# Patient Record
Sex: Female | Born: 1950 | Hispanic: No
Health system: Southern US, Community
[De-identification: ages and names within clinical notes are randomized; demographics above are authoritative.]

## PROBLEM LIST (undated history)

## (undated) DIAGNOSIS — I219 Acute myocardial infarction, unspecified: Secondary | ICD-10-CM

## (undated) DIAGNOSIS — I10 Essential (primary) hypertension: Secondary | ICD-10-CM

---

## 2020-09-02 ENCOUNTER — Emergency Department (HOSPITAL_COMMUNITY): Payer: Medicare Other

## 2020-09-02 ENCOUNTER — Observation Stay (HOSPITAL_COMMUNITY)
Admission: EM | Admit: 2020-09-02 | Discharge: 2020-09-03 | Disposition: A | Discharge status: Home / Self Care | Payer: Medicare Other | Attending: Internal Medicine | Admitting: Internal Medicine

## 2020-09-02 ENCOUNTER — Other Ambulatory Visit: Payer: Self-pay

## 2020-09-02 ENCOUNTER — Encounter (HOSPITAL_COMMUNITY): Payer: Self-pay | Admitting: Emergency Medicine

## 2020-09-02 DIAGNOSIS — Z20822 Contact with and (suspected) exposure to covid-19: Secondary | ICD-10-CM | POA: Diagnosis not present

## 2020-09-02 DIAGNOSIS — W1839XA Other fall on same level, initial encounter: Secondary | ICD-10-CM | POA: Diagnosis not present

## 2020-09-02 DIAGNOSIS — S0181XA Laceration without foreign body of other part of head, initial encounter: Secondary | ICD-10-CM | POA: Diagnosis not present

## 2020-09-02 DIAGNOSIS — E119 Type 2 diabetes mellitus without complications: Secondary | ICD-10-CM | POA: Diagnosis not present

## 2020-09-02 DIAGNOSIS — W19XXXA Unspecified fall, initial encounter: Secondary | ICD-10-CM

## 2020-09-02 DIAGNOSIS — I10 Essential (primary) hypertension: Secondary | ICD-10-CM | POA: Diagnosis not present

## 2020-09-02 DIAGNOSIS — S0292XA Unspecified fracture of facial bones, initial encounter for closed fracture: Secondary | ICD-10-CM | POA: Diagnosis not present

## 2020-09-02 DIAGNOSIS — M1A9XX0 Chronic gout, unspecified, without tophus (tophi): Secondary | ICD-10-CM

## 2020-09-02 DIAGNOSIS — G9389 Other specified disorders of brain: Secondary | ICD-10-CM

## 2020-09-02 DIAGNOSIS — S0291XB Unspecified fracture of skull, initial encounter for open fracture: Secondary | ICD-10-CM

## 2020-09-02 DIAGNOSIS — Z23 Encounter for immunization: Secondary | ICD-10-CM | POA: Insufficient documentation

## 2020-09-02 DIAGNOSIS — S0285XB Fracture of orbit, unspecified, initial encounter for open fracture: Secondary | ICD-10-CM | POA: Diagnosis not present

## 2020-09-02 DIAGNOSIS — S0219XB Other fracture of base of skull, initial encounter for open fracture: Secondary | ICD-10-CM

## 2020-09-02 DIAGNOSIS — M109 Gout, unspecified: Secondary | ICD-10-CM

## 2020-09-02 HISTORY — DX: Essential (primary) hypertension: I10

## 2020-09-02 HISTORY — DX: Acute myocardial infarction, unspecified: I21.9

## 2020-09-02 LAB — BASIC METABOLIC PANEL
Anion gap: 10 (ref 5–15)
BUN: 13 mg/dL (ref 8–23)
CO2: 24 mmol/L (ref 22–32)
Calcium: 9.3 mg/dL (ref 8.9–10.3)
Chloride: 101 mmol/L (ref 98–111)
Creatinine, Ser: 0.84 mg/dL (ref 0.44–1.00)
GFR, Estimated: >60 mL/min (ref >60)
Glucose, Bld: 130 mg/dL — ABNORMAL HIGH (ref 70–99)
Potassium: 3.4 mmol/L — ABNORMAL LOW (ref 3.5–5.1)
Sodium: 135 mmol/L (ref 135–145)

## 2020-09-02 LAB — CBC WITH DIFFERENTIAL/PLATELET
Abs Immature Granulocytes: 0.04 10*3/uL (ref 0.00–0.07)
Basophils Absolute: 0 10*3/uL (ref 0.0–0.1)
Basophils Relative: 0 %
Eosinophils Absolute: 0.1 10*3/uL (ref 0.0–0.5)
Eosinophils Relative: 1 %
HCT: 38.9 % (ref 36.0–46.0)
Hemoglobin: 12.5 g/dL (ref 12.0–15.0)
Immature Granulocytes: 0 %
Lymphocytes Relative: 13 %
Lymphs Abs: 1.4 10*3/uL (ref 0.7–4.0)
MCH: 26.6 pg (ref 26.0–34.0)
MCHC: 32.1 g/dL (ref 30.0–36.0)
MCV: 82.8 fL (ref 80.0–100.0)
Monocytes Absolute: 0.5 10*3/uL (ref 0.1–1.0)
Monocytes Relative: 5 %
Neutro Abs: 9 10*3/uL — ABNORMAL HIGH (ref 1.7–7.7)
Neutrophils Relative %: 81 %
Platelets: 322 10*3/uL (ref 150–400)
RBC: 4.7 MIL/uL (ref 3.87–5.11)
RDW: 13.9 % (ref 11.5–15.5)
WBC: 11 10*3/uL — ABNORMAL HIGH (ref 4.0–10.5)
nRBC: 0 % (ref 0.0–0.2)

## 2020-09-02 MED ORDER — ACETAMINOPHEN 325 MG RE SUPP: 325.0000 mg | Freq: Four times a day (QID) | RECTAL | Status: DC | PRN

## 2020-09-02 MED ORDER — ALLOPURINOL 100 MG PO TABS
100.0000 mg | ORAL_TABLET | Freq: Every day | ORAL | Status: DC
  Administered 2020-09-03: 10:00:00 100 mg via ORAL
  Filled 2020-09-02: qty 1

## 2020-09-02 MED ORDER — HYDROCHLOROTHIAZIDE 12.5 MG PO CAPS
12.5000 mg | ORAL_CAPSULE | Freq: Two times a day (BID) | ORAL | Status: DC
  Administered 2020-09-03: 10:00:00 12.5 mg via ORAL
  Filled 2020-09-02: qty 1

## 2020-09-02 MED ORDER — LISINOPRIL 20 MG PO TABS
20.0000 mg | ORAL_TABLET | Freq: Two times a day (BID) | ORAL | Status: DC
  Administered 2020-09-03: 10:00:00 20 mg via ORAL
  Filled 2020-09-02: qty 1

## 2020-09-02 MED ORDER — FENTANYL CITRATE (PF) 100 MCG/2ML IJ SOLN
25.0000 ug | INTRAMUSCULAR | Status: DC | PRN
Start: 2020-09-02 — End: 2020-09-03
  Administered 2020-09-02 – 2020-09-03 (×4): 25 ug via INTRAVENOUS
  Filled 2020-09-02 (×4): qty 2

## 2020-09-02 MED ORDER — QUINAPRIL-HYDROCHLOROTHIAZIDE 20-12.5 MG PO TABS: 1.0000 | ORAL_TABLET | Freq: Two times a day (BID) | ORAL | Status: DC

## 2020-09-02 MED ORDER — LIDOCAINE-EPINEPHRINE (PF) 2 %-1:200000 IJ SOLN
10.0000 mL | Freq: Once | INTRAMUSCULAR | Status: AC
  Administered 2020-09-02: 10 mL
  Filled 2020-09-02: qty 20

## 2020-09-02 MED ORDER — TETANUS-DIPHTH-ACELL PERTUSSIS 5-2.5-18.5 LF-MCG/0.5 IM SUSY
0.5000 mL | PREFILLED_SYRINGE | Freq: Once | INTRAMUSCULAR | Status: AC
  Administered 2020-09-02: 23:00:00 0.5 mL via INTRAMUSCULAR
  Filled 2020-09-02: qty 0.5

## 2020-09-02 MED ORDER — ONDANSETRON HCL 4 MG/2ML IJ SOLN: 4.0000 mg | Freq: Four times a day (QID) | INTRAMUSCULAR | Status: DC | PRN

## 2020-09-02 MED ORDER — CLINDAMYCIN PHOSPHATE 900 MG/50ML IV SOLN
900.0000 mg | Freq: Once | INTRAVENOUS | Status: AC
  Administered 2020-09-02: 22:00:00 900 mg via INTRAVENOUS
  Filled 2020-09-02: qty 50

## 2020-09-02 MED ORDER — ONDANSETRON HCL 4 MG PO TABS: 4.0000 mg | ORAL_TABLET | Freq: Four times a day (QID) | ORAL | Status: DC | PRN

## 2020-09-02 MED ORDER — ACETAMINOPHEN 325 MG PO TABS: 325.0000 mg | ORAL_TABLET | Freq: Four times a day (QID) | ORAL | Status: DC | PRN

## 2020-09-02 MED ORDER — METOPROLOL SUCCINATE ER 50 MG PO TB24
200.0000 mg | ORAL_TABLET | Freq: Every day | ORAL | Status: DC
  Administered 2020-09-03: 10:00:00 200 mg via ORAL
  Filled 2020-09-02: qty 4

## 2020-09-02 NOTE — H&P (Signed)
History and Physical   Connie Schmidt EXB:[id number] DOB: 05-27-1951 DOA: 09/02/2020  PCP: Dr. Brayton Layman ([place]) Outpatient Specialists: none Patient coming from: home  I have personally briefly reviewed patient's old medical records in [facility] EMR.  Chief Concern: fall  HPI: Connie Schmidt is a 69 y.o. female with medical history significant for hypertension, non-insulin dependent diabetes, gout (last flare was about 1 year ago), presented to the ED for chief concern fo fall and facial laceration.   She is currently visiting her son and daughter-in-law from [place].  She was walking and the neighbor's two dogs chased her. She is afraid of dogs and she slipped and fell down a steep hill. She reports that she hit her face and right arm and right knee. She reports at the peak, her pain was a 10/10, throbbing, persistent. She denies associated vision changes. She reports the fentanyl in the ED improved her pain. She states she is not taking a blood thinner.  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    Social history: she lives at home by herself in [place] and is visiting her son and DIL in [place]. She denies tobacco, etoh, and recreational drugs use.   ROS: Constitutional: no weight change, no fever ENT/Mouth: no sore throat, no rhinorrhea Eyes: no eye pain, no vision changes Cardiovascular: no chest pain, no dyspnea,  no edema, no palpitations Respiratory: no cough, no sputum, no wheezing Gastrointestinal: no nausea, no vomiting, no diarrhea, no constipation Genitourinary: no urinary incontinence, no dysuria, no hematuria Musculoskeletal: no arthralgias, no myalgias Skin: no skin lesions, no pruritus, + laceration on her glabella  Neuro: + weakness, no loss of consciousness, no syncope Psych: no anxiety, no depression, - decrease appetite Heme/Lymph: no bruising, no  bleeding  TPA - caused right eye blindness  ED Course: Discussed with ED provider, patient is requiring admission to [facility] for ENT evaluation and possible surgery.  ENT Dr.Rosen has accepted the patient and request that she be NPO.  Assessment/Plan  Active Problems:   Fall   Comminuted right frontoethmoidal recess and displaced fracture of the anterior right lamina papyracea  - ED provider called ENT doctor called, Dr. Pollyann Kennedy has accepted the patient and request that she be transferred to [facility]  - NPO  -Status post clindamycin 900 mg IV once -Clindamycin 300 mg IV every 6 hours for oral facial laceration with extraconal gas, ENT provider to make further recommendations is appreciated - Inpatient with telemetry   Mildly displaced bone fragments in the supra perio medial right orbit with mild contusion and trace extraconal gas-neurosurgery has been consulted and states no indication for craniotomy  Laceration on glabella - poa, status post suturing per ED provider   Hypertension - elevated - Resumed home antihypertensive  PRN: acetaminophen and ondansetron   Chart reviewed.   DVT prophylaxis: ted hose Code Status: Limited, no cpr/chest compressions. Yes, to intubation.  Diet: NPO  Family Communication: updated daughter in law at bedside. Disposition Plan: pending clinical course.  Consults called: ENT and neuro surgery Admission status: Inpatient with telemetry  Past Medical History:  Diagnosis Date  . Hypertension   . MI (myocardial infarction) (HCC)    History reviewed. No pertinent surgical history.  Social History:  has no history on file for tobacco use, alcohol use, and drug use.  Allergies  Allergen Reactions  .  T-Pa [Alteplase]     Blindness   No family history on file. Family history: Family history reviewed and not pertinent  Prior to Admission medications   Medication Sig Start Date End Date Taking? Authorizing Provider  allopurinol  (ZYLOPRIM) 100 MG tablet Take 100 mg by mouth daily. 07/31/20  Yes [provider]  cholecalciferol (VITAMIN D3) 25 MCG (1000 UNIT) tablet Take 1,000 Units by mouth daily.   Yes [provider]  Cyanocobalamin (VITAMIN B 12 PO) Take 1 tablet by mouth daily at 6 (six) AM.   Yes [provider]  metFORMIN (GLUCOPHAGE) 1000 MG tablet Take 1,000 mg by mouth daily.   Yes [provider]  metoprolol (TOPROL-XL) 200 MG 24 hr tablet Take 200 mg by mouth daily. 07/31/20  Yes [provider]  Omega-3 Fatty Acids (FISH OIL) 1000 MG CPDR Take 1,000 mg by mouth daily.   Yes [provider]  quinapril-hydrochlorothiazide (ACCURETIC) 20-12.5 MG tablet Take 1 tablet by mouth in the morning and at bedtime. 06/27/20  Yes [provider]   Physical Exam: Vitals:   09/03/20 0030 09/03/20 0250 09/03/20 0300 09/03/20 0608  BP: (!) 166/78 (!) 156/83 140/70 (!) 161/92  Pulse: 80 77 67 67  Resp: 15 17  (!) 21  Temp:    98.1 F (36.7 C)  TempSrc:    Oral  SpO2: 100% 95% 92% 96%  Weight:      Height:       Constitutional: appears age-appropriate, NAD, calm, comfortable Eyes: PERRL, lids and conjunctivae normal ENMT: Mucous membranes are moist. Posterior pharynx clear of any exudate or lesions. Age-appropriate dentition. Hearing appropriate Neck: normal, supple, no masses, no thyromegaly Respiratory: clear to auscultation bilaterally, no wheezing, no crackles. Normal respiratory effort. No accessory muscle use.  Cardiovascular: Regular rate and rhythm, no murmurs / rubs / gallops. No extremity edema. 2+ pedal pulses. No carotid bruits.  Abdomen: no tenderness, no masses palpated, no hepatosplenomegaly. Bowel sounds positive.  Musculoskeletal: no clubbing / cyanosis. No joint deformity upper and lower extremities. Good ROM, no contractures, no atrophy. Normal muscle tone.  Skin: Laceration at the forehead present on admission Neurologic: Sensation  intact. Strength 5/5 in all 4.  Psychiatric: Normal judgment and insight. Alert and oriented x 3. Normal mood.   EKG: independently reviewed, showing not indicated  x-rays on Admission: I personally reviewed and I agree with radiologist reading as below.  DG Wrist Complete Right  Result Date: 09/02/2020 CLINICAL DATA:  Pain after a fall EXAM: RIGHT WRIST - COMPLETE 3+ VIEW COMPARISON:  None. FINDINGS: Degenerative changes about the radiocarpal articulation. No acute fracture or dislocation. Scaphoid intact. IMPRESSION: Degenerative change, without acute osseous finding. Electronically Signed   By: Jeronimo Greaves M.D.   On: 09/02/2020 19:27   CT Head Wo Contrast  Result Date: 09/02/2020 CLINICAL DATA:  69 year old female status post fall striking head. Right eye laceration. EXAM: CT HEAD WITHOUT CONTRAST TECHNIQUE: Contiguous axial images were obtained from the base of the skull through the vertex without intravenous contrast. COMPARISON:  Face CT reported separately. FINDINGS: Brain: Despite frontal bone fractures no pneumocephalus identified. No midline shift, ventriculomegaly, mass effect, evidence of mass lesion, intracranial hemorrhage or evidence of cortically based acute infarction. Gray-white matter differentiation is within normal limits throughout the brain. Vascular: Mild Calcified atherosclerosis at the skull base. No suspicious intracranial vascular hyperdensity. Skull: Comminuted, through and through fracture of the right frontal sinus (series 4, image 10) and right frontoethmoidal recess. Associated fracture  of the anterior right lamina papyracea. Anterior wall left frontal sinus fracture on series 4, image 9. See also face CT. No other skull fracture identified. Sinuses/Orbits: Through and through fracture of the right frontal sinus (series 4, image 10) with a small volume of hemorrhage within the anterior frontal and ethmoid air cells. See also face CT reported separately. Tympanic  cavities and mastoids appear clear. Other: Right forehead scalp hematoma with a small volume of subcutaneous gas likely escaping from the fractured anterior paranasal sinuses. Small volume right intraorbital gas also. Underlying chronic postoperative changes to the right globe. See face CT reported separately. Elsewhere scalp soft tissues appear normal. IMPRESSION: 1. Comminuted skull fractures through the frontal sinuses, right frontoethmoidal recess, and medial right orbit. See also face CT reported separately. 2. Normal noncontrast CT appearance of the brain. No pneumocephalus or intracranial hemorrhage identified. 3. Right forehead scalp hematoma with subcutaneous and intraorbital gas. Electronically Signed   By: Odessa Fleming M.D.   On: 09/02/2020 19:14   CT Cervical Spine Wo Contrast  Result Date: 09/02/2020 CLINICAL DATA:  69 year old female status post fall striking head. Right eye laceration. EXAM: CT CERVICAL SPINE WITHOUT CONTRAST TECHNIQUE: Multidetector CT imaging of the cervical spine was performed without intravenous contrast. Multiplanar CT image reconstructions were also generated. COMPARISON:  Head and face CT reported separately today. FINDINGS: Alignment: Mild straightening of cervical lordosis. Cervicothoracic junction alignment is within normal limits. Bilateral posterior element alignment is within normal limits. Skull base and vertebrae: Visualized skull base is intact. No atlanto-occipital dissociation. No acute osseous abnormality identified in the cervical spine. Soft tissues and spinal canal: No prevertebral fluid or swelling. No visible canal hematoma. Negative visible noncontrast neck soft tissues aside from calcified carotid atherosclerosis. Disc levels: Cervical spine degeneration with no cervical spinal stenosis suspected. Upper chest: Visible upper thoracic levels appear intact. Mild motion artifact at the lung apices which appear clear. Calcified aortic atherosclerosis. IMPRESSION:  1. No acute traumatic injury identified in the cervical spine. 2. See Face CT reported separately. Electronically Signed   By: Odessa Fleming M.D.   On: 09/02/2020 19:25   CT Knee Right Wo Contrast  Result Date: 09/02/2020 CLINICAL DATA:  Concern for tibial plateau fracture. EXAM: CT OF THE right KNEE WITHOUT CONTRAST TECHNIQUE: Multidetector CT imaging of the knee was performed according to the standard protocol. Multiplanar CT image reconstructions were also generated. COMPARISON:  X-ray right knee 09/02/2020. FINDINGS: Bones/Joint/Cartilage Subchondral cystic degenerative changes of lateral tibial plateau. Mild degenerative changes of the medial and lateral tibiofemoral compartments. No associated tibial plateau fracture. No acute displaced fracture of the bones of the right knee. Densely sclerotic lesion within the left femoral condyle likely represents a bone island. No right knee effusion or lipohemarthrosis. A Baker's cyst with neck originating between the medial head of the gastrocnemius and semi tendinosis tendon appears lobulated and measures up to at least 3.7 by 1.3 by 2.1 cm (3:30, 9:37). Ligaments Suboptimally assessed by CT. Muscles and Tendons Unremarkable. Soft tissues Unremarkable. No significant hematoma formation. No organized fluid collection. IMPRESSION: 1. No acute displaced fracture of the right knee in a patient with mild degenerative changes of the medial and lateral tibiofemoral compartment. 2. Non-perforated Baker's cyst. Electronically Signed   By: Tish Frederickson M.D.   On: 09/02/2020 20:43   DG Knee Complete 4 Views Right  Result Date: 09/02/2020 CLINICAL DATA:  Pain after a fall. EXAM: RIGHT KNEE - COMPLETE 4+ VIEW COMPARISON:  None. FINDINGS: Osseous irregularity about  the tibial spines on the AP view. Equivocal posterior tibial plateau irregularity on the lateral view. Femur intact. 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 No joint effusion. IMPRESSION:  Equivocal posterior tibial plateau osseous irregularity on lateral view. Likely degenerative osseous irregularity about the tibial spines on AP view. If high clinical concern of acute injury, consider CT to exclude nondisplaced tibial plateau fracture. Electronically Signed   By: Jeronimo GreavesKyle  Talbot M.D.   On: 09/02/2020 19:21   CT Maxillofacial WO CM  Result Date: 09/02/2020 CLINICAL DATA:  69 year old female status post fall striking head. Right eye laceration. EXAM: CT MAXILLOFACIAL WITHOUT CONTRAST TECHNIQUE: Multidetector CT imaging of the maxillofacial structures was performed. Multiplanar CT image reconstructions were also generated. COMPARISON:  Head CT today. FINDINGS: Osseous: Absent dentition. Mandible intact and normally located. Central skull base appears intact. Cervical vertebrae reported separately. No maxilla or nasal bone fracture identified. No zygoma or pterygoid plate fracture. Comminuted and mildly depressed fracture of the right frontal sinus (series 4, image 16) with associated fracture of the posterior sinus wall also (image 14), and oblique fracture plane tracking cephalad in the right frontal bone above the sinus (series 4, image 9). Associated comminution of the right frontoethmoidal recess (series 4, image 20) and associated mildly displaced fracture of the anterior right lamina papyracea. Contralateral anterior wall only left frontal sinus fracture (image 15). Orbits: Mildly displaced fracture of the anterior right lamina papyracea in association with the comminuted right frontoethmoidal recess. Mildly displaced bone fragments into the superomedial orbit (series 9, image 17). The remaining right orbital walls are intact. Trace right orbital gas in the extraconal space. Mild associated medial right orbit contusion (series 7, image 26). Superimposed chronic postoperative changes to the right globe. The left orbit walls and soft tissues are intact. Sinuses: Hemorrhage within the fractured  frontal sinuses and anterior ethmoids. Maxillary, sphenoid, posterior ethmoid sinuses are clear. Tympanic cavities and mastoids are clear. Soft tissues: Negative visible noncontrast thyroid, larynx, pharynx, parapharyngeal spaces, retropharyngeal space, sublingual spaces, submandibular, masticator and parotid spaces. Calcified carotid atherosclerosis in the neck. No upper cervical lymphadenopathy. Limited intracranial: No intracranial gas or hemorrhage identified. Negative visible brain parenchyma, stable to the head CT. IMPRESSION: 1. Comminuted and mildly depressed fracture of the right frontal sinus. Fracture through the posterior wall of the sinus, into the right frontoethmoidal recess and the medial right orbit. Nondisplaced fracture of the anterior wall left frontal sinus. 2. Mildly displaced bone fragments in the superomedial right orbit with mild contusion and trace extraconal gas. 3. Negative visible brain parenchyma. Cervical sp toggle ine CT today reported separately. Electronically Signed   By: Odessa FlemingH  Hall M.D.   On: 09/02/2020 19:23   Labs on Admission: I have personally reviewed following labs  CBC: Recent Labs  Lab 09/02/20 2112  WBC 11.0*  NEUTROABS 9.0*  HGB 12.5  HCT 38.9  MCV 82.8  PLT 322   Basic Metabolic Panel: Recent Labs  Lab 09/02/20 2112  NA 135  K 3.4*  CL 101  CO2 24  GLUCOSE 130*  BUN 13  CREATININE 0.84  CALCIUM 9.3   Martel Galvan N Marice Angelino D.O. [facility]  If 7PM-7AM, please contact overnight-coverage provider If 7AM-7PM, please contact day coverage provider [email or web address]  09/03/2020, 6:26 AM

## 2020-09-02 NOTE — ED Provider Notes (Signed)
[facility]-EMERGENCY DEPT Provider Note   CSN: [id number] Arrival date & time: 09/02/20  1748     History Chief Complaint  Patient presents with  . Laceration  . Fall    Connie Schmidt is a 69 y.o. female with a past medical history of hypertension, MI, who presents today for evaluation after mechanical nonsyncopal fall.  History is obtained from patient and her daughter.  I offered a professional medical interpreter as needed for patient however she refused stating she wished to use her daughter if she had any need for interpreter.  Patient was outside walking when her neighbors dog startled her.  Daughter reports that she is afraid of dogs.  Patient then fell into a ditch.  She struck her head.  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    She also reports pain in her right wrist and right knee.  She does not take any blood tinning medications.  No LOC.  No neck pain, chest or abdominal/back pain.  No pain in left arm or leg.   HPI     Past Medical History:  Diagnosis Date  . Hypertension   . MI (myocardial infarction) [facility])     Patient Active Problem List   Diagnosis Date Noted  . Fall 09/02/2020    History reviewed. No pertinent surgical history.   OB History   No obstetric history on file.     No family history on file.     Home Medications Prior to Admission medications   Medication Sig Start Date End Date Taking? Authorizing Provider  allopurinol (ZYLOPRIM) 100 MG tablet Take 100 mg by mouth daily. 07/31/20  Yes [provider]  cholecalciferol (VITAMIN D3) 25 MCG (1000 UNIT) tablet Take 1,000 Units by mouth daily.   Yes [provider]  Cyanocobalamin (VITAMIN B 12 PO) Take 1 tablet by mouth daily at 6 (six) AM.   Yes [provider]  metFORMIN (GLUCOPHAGE) 1000 MG tablet Take 1,000 mg by mouth daily.   Yes [provider]  metoprolol (TOPROL-XL) 200 MG 24 hr tablet Take 200 mg  by mouth daily. 07/31/20  Yes [provider]  Omega-3 Fatty Acids (FISH OIL) 1000 MG CPDR Take 1,000 mg by mouth daily.   Yes [provider]  quinapril-hydrochlorothiazide (ACCURETIC) 20-12.5 MG tablet Take 1 tablet by mouth in the morning and at bedtime. 06/27/20  Yes [provider]    Allergies    Patient has no known allergies.  Review of Systems   Review of Systems  Constitutional: Negative for chills and fever.  HENT: Positive for facial swelling and nosebleeds. Negative for trouble swallowing and voice change.   Respiratory: Negative for cough and shortness of breath.   Cardiovascular: Negative for chest pain.  Musculoskeletal:       Pain and swelling of right wrist, right knee  Skin: Positive for wound.  Neurological: Positive for headaches. Negative for syncope and weakness.  All other systems reviewed and are negative.   Physical Exam Updated Vital Signs BP (!) 171/80   Pulse 79   Temp 97.9 F (36.6 C) (Oral)   Resp (!) 21   Ht 5' (1.524 m)   Wt 55.3 kg   SpO2 100%   BMI 23.83 kg/m   Physical Exam Vitals and nursing note reviewed.  Constitutional:      General: She is not in acute distress.    Appearance: She is well-developed and well-nourished.  HENT:  Head: Normocephalic.     Comments: There is a 4 cm stelate laceration across the mid forehead.  There is edema and TTP over the nose.  Please see clinical image Eyes:     Conjunctiva/sclera: Conjunctivae normal.  Cardiovascular:     Rate and Rhythm: Normal rate and regular rhythm.     Heart sounds: No murmur heard.   Pulmonary:     Effort: Pulmonary effort is normal. No respiratory distress.     Breath sounds: Normal breath sounds.  Abdominal:     Palpations: Abdomen is soft.     Tenderness: There is no abdominal tenderness.  Musculoskeletal:        General: No edema.     Cervical back: Normal range of motion and neck supple.     Comments: Edema over right wrist,  compartments are soft and easily compressible.  No obvious deformity.  Full ROM of fingers on right hand.   Right knee with diffuse TTP.  No obvious deformity.  Compartments in right leg are soft and easily compressible.   Skin:    General: Skin is warm and dry.  Neurological:     General: No focal deficit present.     Mental Status: She is alert. Mental status is at baseline.     Sensory: No sensory deficit.  Psychiatric:        Mood and Affect: Mood and affect and mood normal.        Behavior: Behavior normal.       ED Results / Procedures / Treatments   Labs (all labs ordered are listed, but only abnormal results are displayed) Labs Reviewed  BASIC METABOLIC PANEL - Abnormal; Notable for the following components:      Result Value   Potassium 3.4 (*)    Glucose, Bld 130 (*)    All other components within normal limits  CBC WITH DIFFERENTIAL/PLATELET - Abnormal; Notable for the following components:   WBC 11.0 (*)    Neutro Abs 9.0 (*)    All other components within normal limits  RESP PANEL BY RT-PCR (FLU A&B, COVID) ARPGX2  HIV ANTIBODY (ROUTINE TESTING W REFLEX)  BASIC METABOLIC PANEL  CBC  PROTIME-INR  APTT    EKG None  Radiology DG Wrist Complete Right  Result Date: 09/02/2020 CLINICAL DATA:  Pain after a fall EXAM: RIGHT WRIST - COMPLETE 3+ VIEW COMPARISON:  None. FINDINGS: Degenerative changes about the radiocarpal articulation. No acute fracture or dislocation. Scaphoid intact. IMPRESSION: Degenerative change, without acute osseous finding. Electronically Signed   By: Jeronimo Greaves M.D.   On: 09/02/2020 19:27   CT Head Wo Contrast  Result Date: 09/02/2020 CLINICAL DATA:  69 year old female status post fall striking head. Right eye laceration. EXAM: CT HEAD WITHOUT CONTRAST TECHNIQUE: Contiguous axial images were obtained from the base of the skull through the vertex without intravenous contrast. COMPARISON:  Face CT reported separately. FINDINGS: Brain:  Despite frontal bone fractures no pneumocephalus identified. No midline shift, ventriculomegaly, mass effect, evidence of mass lesion, intracranial hemorrhage or evidence of cortically based acute infarction. Gray-white matter differentiation is within normal limits throughout the brain. Vascular: Mild Calcified atherosclerosis at the skull base. No suspicious intracranial vascular hyperdensity. Skull: Comminuted, through and through fracture of the right frontal sinus (series 4, image 10) and right frontoethmoidal recess. Associated fracture of the anterior right lamina papyracea. Anterior wall left frontal sinus fracture on series 4, image 9. See also face CT. No other skull fracture identified. Sinuses/Orbits: Through and  through fracture of the right frontal sinus (series 4, image 10) with a small volume of hemorrhage within the anterior frontal and ethmoid air cells. See also face CT reported separately. Tympanic cavities and mastoids appear clear. Other: Right forehead scalp hematoma with a small volume of subcutaneous gas likely escaping from the fractured anterior paranasal sinuses. Small volume right intraorbital gas also. Underlying chronic postoperative changes to the right globe. See face CT reported separately. Elsewhere scalp soft tissues appear normal. IMPRESSION: 1. Comminuted skull fractures through the frontal sinuses, right frontoethmoidal recess, and medial right orbit. See also face CT reported separately. 2. Normal noncontrast CT appearance of the brain. No pneumocephalus or intracranial hemorrhage identified. 3. Right forehead scalp hematoma with subcutaneous and intraorbital gas. Electronically Signed   By: Odessa FlemingH  Hall M.D.   On: 09/02/2020 19:14   CT Cervical Spine Wo Contrast  Result Date: 09/02/2020 CLINICAL DATA:  69 year old female status post fall striking head. Right eye laceration. EXAM: CT CERVICAL SPINE WITHOUT CONTRAST TECHNIQUE: Multidetector CT imaging of the cervical spine was  performed without intravenous contrast. Multiplanar CT image reconstructions were also generated. COMPARISON:  Head and face CT reported separately today. FINDINGS: Alignment: Mild straightening of cervical lordosis. Cervicothoracic junction alignment is within normal limits. Bilateral posterior element alignment is within normal limits. Skull base and vertebrae: Visualized skull base is intact. No atlanto-occipital dissociation. No acute osseous abnormality identified in the cervical spine. Soft tissues and spinal canal: No prevertebral fluid or swelling. No visible canal hematoma. Negative visible noncontrast neck soft tissues aside from calcified carotid atherosclerosis. Disc levels: Cervical spine degeneration with no cervical spinal stenosis suspected. Upper chest: Visible upper thoracic levels appear intact. Mild motion artifact at the lung apices which appear clear. Calcified aortic atherosclerosis. IMPRESSION: 1. No acute traumatic injury identified in the cervical spine. 2. See Face CT reported separately. Electronically Signed   By: Odessa FlemingH  Hall M.D.   On: 09/02/2020 19:25   CT Knee Right Wo Contrast  Result Date: 09/02/2020 CLINICAL DATA:  Concern for tibial plateau fracture. EXAM: CT OF THE right KNEE WITHOUT CONTRAST TECHNIQUE: Multidetector CT imaging of the knee was performed according to the standard protocol. Multiplanar CT image reconstructions were also generated. COMPARISON:  X-ray right knee 09/02/2020. FINDINGS: Bones/Joint/Cartilage Subchondral cystic degenerative changes of lateral tibial plateau. Mild degenerative changes of the medial and lateral tibiofemoral compartments. No associated tibial plateau fracture. No acute displaced fracture of the bones of the right knee. Densely sclerotic lesion within the left femoral condyle likely represents a bone island. No right knee effusion or lipohemarthrosis. A Baker's cyst with neck originating between the medial head of the gastrocnemius and semi  tendinosis tendon appears lobulated and measures up to at least 3.7 by 1.3 by 2.1 cm (3:30, 9:37). Ligaments Suboptimally assessed by CT. Muscles and Tendons Unremarkable. Soft tissues Unremarkable. No significant hematoma formation. No organized fluid collection. IMPRESSION: 1. No acute displaced fracture of the right knee in a patient with mild degenerative changes of the medial and lateral tibiofemoral compartment. 2. Non-perforated Baker's cyst. Electronically Signed   By: Tish FredericksonMorgane  Naveau M.D.   On: 09/02/2020 20:43   DG Knee Complete 4 Views Right  Result Date: 09/02/2020 CLINICAL DATA:  Pain after a fall. EXAM: RIGHT KNEE - COMPLETE 4+ VIEW COMPARISON:  None. FINDINGS: Osseous irregularity about the tibial spines on the AP view. Equivocal posterior tibial plateau irregularity on the lateral view. Femur intact. 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 No joint effusion. IMPRESSION:  Equivocal posterior tibial plateau osseous irregularity on lateral view. Likely degenerative osseous irregularity about the tibial spines on AP view. If high clinical concern of acute injury, consider CT to exclude nondisplaced tibial plateau fracture. Electronically Signed   By: Kyle  TaJeronimo Greaves   On: 09/02/2020 19:21   CT Maxillofacial WO CM  Result Date: 09/02/2020 CLINICAL DATA:  69 year old female status post fall striking head. Right eye laceration. EXAM: CT MAXILLOFACIAL WITHOUT CONTRAST TECHNIQUE: Multidetector CT imaging of the maxillofacial structures was performed. Multiplanar CT image reconstructions were also generated. COMPARISON:  Head CT today. FINDINGS: Osseous: Absent dentition. Mandible intact and normally located. Central skull base appears intact. Cervical vertebrae reported separately. No maxilla or nasal bone fracture identified. No zygoma or pterygoid plate fracture. Comminuted and mildly depressed fracture of the right frontal sinus (series 4, image 16) with associated  fracture of the posterior sinus wall also (image 14), and oblique fracture plane tracking cephalad in the right frontal bone above the sinus (series 4, image 9). Associated comminution of the right frontoethmoidal recess (series 4, image 20) and associated mildly displaced fracture of the anterior right lamina papyracea. Contralateral anterior wall only left frontal sinus fracture (image 15). Orbits: Mildly displaced fracture of the anterior right lamina papyracea in association with the comminuted right frontoethmoidal recess. Mildly displaced bone fragments into the superomedial orbit (series 9, image 17). The remaining right orbital walls are intact. Trace right orbital gas in the extraconal space. Mild associated medial right orbit contusion (series 7, image 26). Superimposed chronic postoperative changes to the right globe. The left orbit walls and soft tissues are intact. Sinuses: Hemorrhage within the fractured frontal sinuses and anterior ethmoids. Maxillary, sphenoid, posterior ethmoid sinuses are clear. Tympanic cavities and mastoids are clear. Soft tissues: Negative visible noncontrast thyroid, larynx, pharynx, parapharyngeal spaces, retropharyngeal space, sublingual spaces, submandibular, masticator and parotid spaces. Calcified carotid atherosclerosis in the neck. No upper cervical lymphadenopathy. Limited intracranial: No intracranial gas or hemorrhage identified. Negative visible brain parenchyma, stable to the head CT. IMPRESSION: 1. Comminuted and mildly depressed fracture of the right frontal sinus. Fracture through the posterior wall of the sinus, into the right frontoethmoidal recess and the medial right orbit. Nondisplaced fracture of the anterior wall left frontal sinus. 2. Mildly displaced bone fragments in the superomedial right orbit with mild contusion and trace extraconal gas. 3. Negative visible brain parenchyma. Cervical sp toggle ine CT today reported separately. Electronically Signed    By: Odessa Fleming M.D.   On: 09/02/2020 19:23    Procedures .Marland KitchenLaceration Repair  Date/Time: 09/03/2020 12:09 AM Performed by: Cristina Gong, PA-C Authorized by: Cristina Gong, PA-C   Consent:    Consent obtained:  Verbal   Consent given by:  Patient   Risks discussed:  Infection, need for additional repair, poor cosmetic result, pain, retained foreign body, tendon damage, vascular damage, poor wound healing and nerve damage   Alternatives discussed:  No treatment and referral (Alternative wound closures) Anesthesia:    Anesthesia method:  Local infiltration   Local anesthetic:  Lidocaine 2% WITH epi Laceration details:    Location:  Face   Face location:  Forehead   Length (cm):  4 Pre-procedure details:    Preparation:  Patient was prepped and draped in usual sterile fashion and imaging obtained to evaluate for foreign bodies Exploration:    Hemostasis achieved with:  Epinephrine   Wound extent: areolar tissue violated and underlying fracture     Contaminated: yes   Treatment:  Area cleansed with:  Saline   Amount of cleaning:  Extensive   Irrigation solution:  Sterile saline Skin repair:    Repair method:  Sutures   Suture size:  4-0   Suture material:  Prolene   Number of sutures:  4 Approximation:    Approximation:  Loose Repair type:    Repair type:  Simple Post-procedure details:    Dressing:  Non-adherent dressing   Procedure completion:  Tolerated well, no immediate complications  .Critical Care Performed by: Cristina Gong, PA-C Authorized by: Cristina Gong, PA-C   Critical care provider statement:    Critical care time (minutes):  45   Critical care time was exclusive of:  Separately billable procedures and treating other patients and teaching time   Critical care was time spent personally by me on the following activities:  Discussions with consultants, evaluation of patient's response to treatment, examination of patient, ordering  and performing treatments and interventions, ordering and review of laboratory studies, ordering and review of radiographic studies, pulse oximetry, re-evaluation of patient's condition, obtaining history from patient or surrogate and review of old charts   (including critical care time)  Medications Ordered in ED Medications  fentaNYL (SUBLIMAZE) injection 25 mcg (25 mcg Intravenous Given 09/02/20 2258)  clindamycin (CLEOCIN) IVPB 900 mg (0 mg Intravenous Stopped 09/02/20 2214)  Tdap (BOOSTRIX) injection 0.5 mL (0.5 mLs Intramuscular Given 09/02/20 2259)  lidocaine-EPINEPHrine (XYLOCAINE W/EPI) 2 %-1:200000 (PF) injection 10 mL (10 mLs Infiltration Given by Other 09/02/20 2303)    ED Course  I have reviewed the triage vital signs and the nursing notes.  Pertinent labs & imaging results that were available during my care of the patient were reviewed by me and considered in my medical decision making (see chart for details).  Clinical Course as of 09/03/20 0023  Sat Sep 02, 2020  2018 I spoke with Dr. Franky Macho with neruosurgery who will see the patient in consult, requests I call ENT. He reports that from neurosurgical side she does not require antibiotics.  We did discuss that my read of face CT shows concern for punctate pneumocephalus.  [EH]  2203 I spoke with Dr. Ross Marcus on call for ENT trauma.  He is going to touch base with Dr. Pollyann Kennedy, on call for general ENT and call me back.  [EH]    Clinical Course User Index [EH] Norman Clay   MDM Rules/Calculators/A&P                          Patient is a 7 69 year old woman who presents today for evaluation of mechanical, nonsyncopal fall.  She struck her forehead during this sustaining a laceration. CT head, neck, and max face were obtained.  She has extensive sinus and facial fractures including comminuted and mildly distressed fracture of the right frontal sinus, fragments in the right orbit, from which she has no vision  at baseline.  Radiology reported no pneumocephalus, however on my view of the max face CT scan I do appreciate a small area of pneumocephalus.  Given concern for open skull fracture with possible pneumocephalus I spoke with Dr. Franky Macho, on-call for neurosurgery who consulted on the patient, recommended ENT involvement.  He recommended 900mg  of IV clindamycin.   I spoke with Dr. , on-call for ENT who primarily is an oral surgeon and spoke with Dr. Ross Marcus of ENT.  Dr. Pollyann Kennedy and I discussed treatment options.  Patient will need repair.  He recommended that I staple or loosely close the skin stating that it would be reopened most likely tomorrow in the OR.  Patient is to be n.p.o. at midnight, and he states that it is okay to Covid swab patient.  He requests medicine admit patient [facility].    I spoke with Dr. Sedalia Muta who will admit patient.   The patient appears reasonably stabilized for admission considering the current resources, flow, and capabilities available in the ED at this time, and I doubt any other [facility] requiring further screening and/or treatment in the ED prior to admission assuming timely admission and bed placement.  Note: Portions of this report may have been transcribed using voice recognition software. Every effort was made to ensure accuracy; however, inadvertent computerized transcription errors may be present  Final Clinical Impression(s) / ED Diagnoses Final diagnoses:  Fall, initial encounter  Open fracture of frontal sinus, initial encounter (HCC)  Pneumocephalus, traumatic  Open fracture of skull, unspecified bone, initial encounter [facility])  Open fracture of orbital wall, initial encounter [facility])    Rx / DC Orders ED Discharge Orders    None       Cristina Gong, PA-C 09/03/20 0024    Alvira Monday, MD 09/04/20 [id number]

## 2020-09-02 NOTE — ED Triage Notes (Signed)
Patient reports fall while walking hitting head on ground. Laceration next to right eye. Denies LOC.

## 2020-09-02 NOTE — Consult Note (Signed)
Reason for Consult:open frontal skull fracture Referring Physician: Merlin, Connie is an 69 y.o. female.  HPI: whom while taking a walk was chased by two dogs resulting in her falling. Witnessed by neighbors, no loss of consciousness. Connie Schmidt was alert and oriented at the scene, without new neurological deficits. Head CT showed frontal sinus fractures, and pneumocephalus underlying the laceration sustained on the forehead in the area of the glabella. Upon arrival to [facility] ED no neurological deterioration was noted.   Past Medical History:  Diagnosis Date  . Hypertension   . MI (myocardial infarction) (HCC)     History reviewed. No pertinent surgical history.  No family history on file.  Social History:  has no history on file for tobacco use, alcohol use, and drug use.  Allergies: No Known Allergies  Medications: I have reviewed the patient's current medications.  No results found for this or any previous visit (from the past 48 hour(s)).  DG Wrist Complete Right  Result Date: 09/02/2020 CLINICAL DATA:  Pain after a fall EXAM: RIGHT WRIST - COMPLETE 3+ VIEW COMPARISON:  None. FINDINGS: Degenerative changes about the radiocarpal articulation. No acute fracture or dislocation. Scaphoid intact. IMPRESSION: Degenerative change, without acute osseous finding. Electronically Signed   By: Jeronimo Greaves M.D.   On: 09/02/2020 19:27   CT Head Wo Contrast  Result Date: 09/02/2020 CLINICAL DATA:  69 year old female status post fall striking head. Right eye laceration. EXAM: CT HEAD WITHOUT CONTRAST TECHNIQUE: Contiguous axial images were obtained from the base of the skull through the vertex without intravenous contrast. COMPARISON:  Face CT reported separately. FINDINGS: Brain: Despite frontal bone fractures no pneumocephalus identified. No midline shift, ventriculomegaly, mass effect, evidence of mass lesion, intracranial hemorrhage or evidence of cortically based acute  infarction. Gray-white matter differentiation is within normal limits throughout the brain. Vascular: Mild Calcified atherosclerosis at the skull base. No suspicious intracranial vascular hyperdensity. Skull: Comminuted, through and through fracture of the right frontal sinus (series 4, image 10) and right frontoethmoidal recess. Associated fracture of the anterior right lamina papyracea. Anterior wall left frontal sinus fracture on series 4, image 9. See also face CT. No other skull fracture identified. Sinuses/Orbits: Through and through fracture of the right frontal sinus (series 4, image 10) with a small volume of hemorrhage within the anterior frontal and ethmoid air cells. See also face CT reported separately. Tympanic cavities and mastoids appear clear. Other: Right forehead scalp hematoma with a small volume of subcutaneous gas likely escaping from the fractured anterior paranasal sinuses. Small volume right intraorbital gas also. Underlying chronic postoperative changes to the right globe. See face CT reported separately. Elsewhere scalp soft tissues appear normal. IMPRESSION: 1. Comminuted skull fractures through the frontal sinuses, right frontoethmoidal recess, and medial right orbit. See also face CT reported separately. 2. Normal noncontrast CT appearance of the brain. No pneumocephalus or intracranial hemorrhage identified. 3. Right forehead scalp hematoma with subcutaneous and intraorbital gas. Electronically Signed   By: Odessa Fleming M.D.   On: 09/02/2020 19:14   CT Cervical Spine Wo Contrast  Result Date: 09/02/2020 CLINICAL DATA:  69 year old female status post fall striking head. Right eye laceration. EXAM: CT CERVICAL SPINE WITHOUT CONTRAST TECHNIQUE: Multidetector CT imaging of the cervical spine was performed without intravenous contrast. Multiplanar CT image reconstructions were also generated. COMPARISON:  Head and face CT reported separately today. FINDINGS: Alignment: Mild straightening of  cervical lordosis. Cervicothoracic junction alignment is within normal limits. Bilateral posterior element  alignment is within normal limits. Skull base and vertebrae: Visualized skull base is intact. No atlanto-occipital dissociation. No acute osseous abnormality identified in the cervical spine. Soft tissues and spinal canal: No prevertebral fluid or swelling. No visible canal hematoma. Negative visible noncontrast neck soft tissues aside from calcified carotid atherosclerosis. Disc levels: Cervical spine degeneration with no cervical spinal stenosis suspected. Upper chest: Visible upper thoracic levels appear intact. Mild motion artifact at the lung apices which appear clear. Calcified aortic atherosclerosis. IMPRESSION: 1. No acute traumatic injury identified in the cervical spine. 2. See Face CT reported separately. Electronically Signed   By: Odessa FlemingH  Hall M.D.   On: 09/02/2020 19:25   CT Knee Right Wo Contrast  Result Date: 09/02/2020 CLINICAL DATA:  Concern for tibial plateau fracture. EXAM: CT OF THE right KNEE WITHOUT CONTRAST TECHNIQUE: Multidetector CT imaging of the knee was performed according to the standard protocol. Multiplanar CT image reconstructions were also generated. COMPARISON:  X-ray right knee 09/02/2020. FINDINGS: Bones/Joint/Cartilage Subchondral cystic degenerative changes of lateral tibial plateau. Mild degenerative changes of the medial and lateral tibiofemoral compartments. No associated tibial plateau fracture. No acute displaced fracture of the bones of the right knee. Densely sclerotic lesion within the left femoral condyle likely represents a bone island. No right knee effusion or lipohemarthrosis. A Baker's cyst with neck originating between the medial head of the gastrocnemius and semi tendinosis tendon appears lobulated and measures up to at least 3.7 by 1.3 by 2.1 cm (3:30, 9:37). Ligaments Suboptimally assessed by CT. Muscles and Tendons Unremarkable. Soft tissues  Unremarkable. No significant hematoma formation. No organized fluid collection. IMPRESSION: 1. No acute displaced fracture of the right knee in a patient with mild degenerative changes of the medial and lateral tibiofemoral compartment. 2. Non-perforated Baker's cyst. Electronically Signed   By: Tish FredericksonMorgane  Naveau M.D.   On: 09/02/2020 20:43   DG Knee Complete 4 Views Right  Result Date: 09/02/2020 CLINICAL DATA:  Pain after a fall. EXAM: RIGHT KNEE - COMPLETE 4+ VIEW COMPARISON:  None. FINDINGS: Osseous irregularity about the tibial spines on the AP view. Equivocal posterior tibial plateau irregularity on the lateral view. Femur intact. 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 No joint effusion. IMPRESSION: Equivocal posterior tibial plateau osseous irregularity on lateral view. Likely degenerative osseous irregularity about the tibial spines on AP view. If high clinical concern of acute injury, consider CT to exclude nondisplaced tibial plateau fracture. Electronically Signed   By: Jeronimo GreavesKyle  Talbot M.D.   On: 09/02/2020 19:21   CT Maxillofacial WO CM  Result Date: 09/02/2020 CLINICAL DATA:  69 year old female status post fall striking head. Right eye laceration. EXAM: CT MAXILLOFACIAL WITHOUT CONTRAST TECHNIQUE: Multidetector CT imaging of the maxillofacial structures was performed. Multiplanar CT image reconstructions were also generated. COMPARISON:  Head CT today. FINDINGS: Osseous: Absent dentition. Mandible intact and normally located. Central skull base appears intact. Cervical vertebrae reported separately. No maxilla or nasal bone fracture identified. No zygoma or pterygoid plate fracture. Comminuted and mildly depressed fracture of the right frontal sinus (series 4, image 16) with associated fracture of the posterior sinus wall also (image 14), and oblique fracture plane tracking cephalad in the right frontal bone above the sinus (series 4, image 9). Associated comminution of  the right frontoethmoidal recess (series 4, image 20) and associated mildly displaced fracture of the anterior right lamina papyracea. Contralateral anterior wall only left frontal sinus fracture (image 15). Orbits: Mildly displaced fracture of the anterior right lamina papyracea in association with  the comminuted right frontoethmoidal recess. Mildly displaced bone fragments into the superomedial orbit (series 9, image 17). The remaining right orbital walls are intact. Trace right orbital gas in the extraconal space. Mild associated medial right orbit contusion (series 7, image 26). Superimposed chronic postoperative changes to the right globe. The left orbit walls and soft tissues are intact. Sinuses: Hemorrhage within the fractured frontal sinuses and anterior ethmoids. Maxillary, sphenoid, posterior ethmoid sinuses are clear. Tympanic cavities and mastoids are clear. Soft tissues: Negative visible noncontrast thyroid, larynx, pharynx, parapharyngeal spaces, retropharyngeal space, sublingual spaces, submandibular, masticator and parotid spaces. Calcified carotid atherosclerosis in the neck. No upper cervical lymphadenopathy. Limited intracranial: No intracranial gas or hemorrhage identified. Negative visible brain parenchyma, stable to the head CT. IMPRESSION: 1. Comminuted and mildly depressed fracture of the right frontal sinus. Fracture through the posterior wall of the sinus, into the right frontoethmoidal recess and the medial right orbit. Nondisplaced fracture of the anterior wall left frontal sinus. 2. Mildly displaced bone fragments in the superomedial right orbit with mild contusion and trace extraconal gas. 3. Negative visible brain parenchyma. Cervical sp toggle ine CT today reported separately. Electronically Signed   By: Odessa Fleming M.D.   On: 09/02/2020 19:23    Review of Systems  Constitutional: Negative.   HENT: Negative.   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    Cardiovascular: Negative.   Gastrointestinal: Negative.   Endocrine: Negative.   Genitourinary: Negative.   Musculoskeletal: Negative.   Skin: Negative.   Allergic/Immunologic: Negative.   Neurological: Negative.   Hematological: Negative.   Psychiatric/Behavioral: Negative.    Blood pressure (!) 173/66, pulse 78, temperature 97.9 F (36.6 C), temperature source Oral, resp. rate 17, height 5' (1.524 m), weight 55.3 kg, SpO2 99 %. Physical Exam Vitals reviewed.  Constitutional:      General: She is in acute distress.     Appearance: Normal appearance. She is normal weight.  HENT:     Head: Normocephalic.     Comments: Laceration along the midline and extending to the right side at the glabella, complex    Right Ear: Tympanic membrane, ear canal and external ear normal.     Left Ear: Tympanic membrane, ear canal and external ear normal.     Mouth/Throat:     Mouth: Mucous membranes are moist.     Pharynx: Oropharynx is clear.  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  Pulmonary:     Effort: Pulmonary effort is normal.     Breath sounds: Normal breath sounds.  Abdominal:     General: Abdomen is flat.     Palpations: Abdomen is soft.  Musculoskeletal:        General: Normal range of motion.     Cervical back: Normal range of motion and neck supple.  Skin:    General: Skin is warm and dry.  Neurological:     Mental Status: She is alert.     Cranial Nerves: Cranial nerve deficit present.     Assessment/Plan: No indication for a craniotomy. I believe ENT/Face will need to evaluate fractures. Have instructed that she receive clindamycin given length of time. Needs a tetanus booster also. If there is no neurological change there is no need for a repeat scan from my standpoint. No great concern about the sinus fracture. Believe this will heal once laceration  is closed. Call if questions  Coletta Memos 09/02/2020, 9:17 PM

## 2020-09-03 DIAGNOSIS — S020XXB Fracture of vault of skull, initial encounter for open fracture: Secondary | ICD-10-CM

## 2020-09-03 DIAGNOSIS — M109 Gout, unspecified: Secondary | ICD-10-CM

## 2020-09-03 DIAGNOSIS — W19XXXA Unspecified fall, initial encounter: Secondary | ICD-10-CM

## 2020-09-03 DIAGNOSIS — E119 Type 2 diabetes mellitus without complications: Secondary | ICD-10-CM

## 2020-09-03 DIAGNOSIS — I10 Essential (primary) hypertension: Secondary | ICD-10-CM | POA: Diagnosis present

## 2020-09-03 DIAGNOSIS — S0292XA Unspecified fracture of facial bones, initial encounter for closed fracture: Secondary | ICD-10-CM | POA: Diagnosis present

## 2020-09-03 LAB — PROTIME-INR
INR: 1.1 (ref 0.8–1.2)
Prothrombin Time: 14.2 seconds (ref 11.4–15.2)

## 2020-09-03 LAB — BASIC METABOLIC PANEL
Anion gap: 11 (ref 5–15)
BUN: 11 mg/dL (ref 8–23)
CO2: 24 mmol/L (ref 22–32)
Calcium: 9.4 mg/dL (ref 8.9–10.3)
Chloride: 100 mmol/L (ref 98–111)
Creatinine, Ser: 0.79 mg/dL (ref 0.44–1.00)
GFR, Estimated: >60 mL/min (ref >60)
Glucose, Bld: 132 mg/dL — ABNORMAL HIGH (ref 70–99)
Potassium: 3.9 mmol/L (ref 3.5–5.1)
Sodium: 135 mmol/L (ref 135–145)

## 2020-09-03 LAB — RESP PANEL BY RT-PCR (FLU A&B, COVID) ARPGX2
Influenza A by PCR: NEGATIVE
Influenza B by PCR: NEGATIVE
SARS Coronavirus 2 by RT PCR: NEGATIVE

## 2020-09-03 LAB — CBC
HCT: 37.2 % (ref 36.0–46.0)
Hemoglobin: 11.9 g/dL — ABNORMAL LOW (ref 12.0–15.0)
MCH: 26.6 pg (ref 26.0–34.0)
MCHC: 32 g/dL (ref 30.0–36.0)
MCV: 83.2 fL (ref 80.0–100.0)
Platelets: 296 10*3/uL (ref 150–400)
RBC: 4.47 MIL/uL (ref 3.87–5.11)
RDW: 14.1 % (ref 11.5–15.5)
WBC: 9.8 10*3/uL (ref 4.0–10.5)
nRBC: 0 % (ref 0.0–0.2)

## 2020-09-03 LAB — HIV ANTIBODY (ROUTINE TESTING W REFLEX): HIV Screen 4th Generation wRfx: NONREACTIVE

## 2020-09-03 LAB — CBG MONITORING, ED: Glucose-Capillary: 119 mg/dL — ABNORMAL HIGH (ref 70–99)

## 2020-09-03 LAB — APTT: aPTT: 26 seconds (ref 24–36)

## 2020-09-03 MED ORDER — INSULIN ASPART 100 UNIT/ML ~~LOC~~ SOLN
0.0000 [IU] | Freq: Three times a day (TID) | SUBCUTANEOUS | Status: DC
  Filled 2020-09-03: qty 0.15

## 2020-09-03 MED ORDER — INSULIN ASPART 100 UNIT/ML ~~LOC~~ SOLN
0.0000 [IU] | Freq: Every day | SUBCUTANEOUS | Status: DC
  Filled 2020-09-03: qty 0.05

## 2020-09-03 MED ORDER — IBUPROFEN 400 MG PO TABS: 400.0000 mg | ORAL_TABLET | Freq: Four times a day (QID) | ORAL | 0 refills | Status: AC | PRN

## 2020-09-03 MED ORDER — CLINDAMYCIN PHOSPHATE 300 MG/50ML IV SOLN
300.0000 mg | Freq: Four times a day (QID) | INTRAVENOUS | Status: DC
  Administered 2020-09-03: 08:00:00 300 mg via INTRAVENOUS
  Filled 2020-09-03: qty 50

## 2020-09-03 MED ORDER — MUPIROCIN 2 % EX OINT: 1.0000 "application " | TOPICAL_OINTMENT | Freq: Two times a day (BID) | CUTANEOUS | 0 refills | Status: AC

## 2020-09-03 NOTE — Care Management Obs Status (Signed)
MEDICARE OBSERVATION STATUS NOTIFICATION   Patient Details  Name: Connie Schmidt MRN: [id number] Date of Birth: 1950-12-15   Medicare Observation Status Notification Given:  Yes    Joseph Art, LCSWA 09/03/2020, 11:04 AM

## 2020-09-03 NOTE — ED Notes (Signed)
Attempted to call report to [facility], but no answer.

## 2020-09-03 NOTE — Care Management CC44 (Signed)
Condition Code 44 Documentation Completed  Patient Details  Name: Connie Schmidt MRN: [id number] Date of Birth: 03-08-1951   Condition Code 44 given:  Yes Patient signature on Condition Code 44 notice:  Yes Documentation of 2 MD's agreement:  Yes Code 44 added to claim:  Yes    Joseph Art, LCSWA 09/03/2020, 11:04 AM

## 2020-09-03 NOTE — ED Notes (Signed)
Pt ambulated down the hallway and back with no impaired gait, no assistance. Pt denied any dizziness upon standing or while ambulating

## 2020-09-03 NOTE — Discharge Summary (Signed)
Physician Discharge Summary  Connie Schmidt YIF:[id number] DOB: 1951/04/20 DOA: 09/02/2020  PCP: System, Provider Not In  Admit date: 09/02/2020 Discharge date: 09/03/2020  Admitted From: Home Disposition: Home  Recommendations for Outpatient Follow-up:  1. Follow up with PCP in 1 week with repeat CBC/BMP 2. Outpatient follow-up with ENT 3. Outpatient follow-up with [facility], ED/PCP for suture removal in a week 4. Follow up in ED if symptoms worsen or new appear   Home Health: No Equipment/Devices: None  Discharge Condition: Stable CODE STATUS: Partial: No CPR/chest compressions but yes to intubation Diet recommendation: Heart healthy/carb modified  Brief/Interim Summary: 69 year old female with history of hypertension, non-insulin-dependent diabetes mellitus, gout presented with fall after having being chased by neighbors 2 dogs.  She slipped and fell down a steep hill and reported that she hit her face and right arm and right knee.  On presentation, she was found to have laceration in the forehead which was repaired in the ED along with sinus and facial fractures.  CT of the head was negative for intracranial bleeding.  Neurosurgery evaluated the patient and recommended ENT evaluation.  ENT recommended surgical intervention but patient and his family decided against surgical intervention.  ENT has cleared the patient for discharge.  She will be discharged home with outpatient follow-up with ENT.  She can return back to ED for removal of her sutures in a week.  Discharge Diagnoses:  Mechanical fall resulting in comminuted right frontoethmoidal recess and displaced fracture of the anterior right lamina papyracea and forehead laceration Mildly displaced bone fragments in the supra perio medial right orbit with mild contusion and trace extraconal gas -For laceration has been repaired in the ED.  Neurosurgery recommended ENT evaluation.  ENT offered surgical intervention but patient/family  has decided against surgical intervention.  ENT has cleared the patient for discharge. -She will be discharged home with outpatient follow-up with ENT.  She can return back to ED for removal of her sutures in a week. -She is currently hemodynamically stable.  Will not need any oral antibiotics on discharge.  ENT recommends antibiotic ointment twice a day for a week.  Hypertension -Continue home regimen.  Outpatient follow-up with PCP  Diabetes mellitus type 2 -Continue Metformin on discharge.  Carb modified diet  Gout -Continue allopurinol  Discharge Instructions  Discharge Instructions    Diet - low sodium heart healthy   Complete by: As directed    Diet Carb Modified   Complete by: As directed    Increase activity slowly   Complete by: As directed      Allergies as of 09/03/2020      Reactions   T-pa [alteplase]    Blindness      Medication List    TAKE these medications   allopurinol 100 MG tablet Commonly known as: ZYLOPRIM Take 100 mg by mouth daily.   cholecalciferol 25 MCG (1000 UNIT) tablet Commonly known as: VITAMIN D3 Take 1,000 Units by mouth daily.   Fish Oil 1000 MG Cpdr Take 1,000 mg by mouth daily.   ibuprofen 400 MG tablet Commonly known as: ADVIL Take 1 tablet (400 mg total) by mouth every 6 (six) hours as needed for moderate pain.   metFORMIN 1000 MG tablet Commonly known as: GLUCOPHAGE Take 1,000 mg by mouth daily.   metoprolol 200 MG 24 hr tablet Commonly known as: TOPROL-XL Take 200 mg by mouth daily.   mupirocin ointment 2 % Commonly known as: BACTROBAN Apply 1 application topically 2 (two) times daily.  Apply around affected laceration area for 7 days   quinapril-hydrochlorothiazide 20-12.5 MG tablet Commonly known as: ACCURETIC Take 1 tablet by mouth in the morning and at bedtime.   VITAMIN B 12 PO Take 1 tablet by mouth daily at 6 (six) AM.       Follow-up Information    Serena Colonel, MD. Call.   Specialty:  Otolaryngology Why: Call if there are any concerns or issues. Contact information: [address] [employer] [address] [place] [address] [phone]        [facility]. Schedule an appointment as soon as possible for a visit in 1 week(s).   Why: For suture removal Contact information: [address] [place] [address] [phone]             Allergies  Allergen Reactions  . T-Pa [Alteplase]     Blindness    Consultations:  Neurosurgery/ENT   Procedures/Studies: DG Wrist Complete Right  Result Date: 09/02/2020 CLINICAL DATA:  Pain after a fall EXAM: RIGHT WRIST - COMPLETE 3+ VIEW COMPARISON:  None. FINDINGS: Degenerative changes about the radiocarpal articulation. No acute fracture or dislocation. Scaphoid intact. IMPRESSION: Degenerative change, without acute osseous finding. Electronically Signed   By: Jeronimo Greaves M.D.   On: 09/02/2020 19:27   CT Head Wo Contrast  Result Date: 09/02/2020 CLINICAL DATA:  69 year old female status post fall striking head. Right eye laceration. EXAM: CT HEAD WITHOUT CONTRAST TECHNIQUE: Contiguous axial images were obtained from the base of the skull through the vertex without intravenous contrast. COMPARISON:  Face CT reported separately. FINDINGS: Brain: Despite frontal bone fractures no pneumocephalus identified. No midline shift, ventriculomegaly, mass effect, evidence of mass lesion, intracranial hemorrhage or evidence of cortically based acute infarction. Gray-white matter differentiation is within normal limits throughout the brain. Vascular: Mild Calcified atherosclerosis at the skull base. No suspicious intracranial vascular hyperdensity. Skull: Comminuted, through and through fracture of the right frontal sinus (series 4, image 10) and right frontoethmoidal recess. Associated fracture of the anterior right lamina papyracea. Anterior wall left frontal sinus fracture on series 4, image 9. See  also face CT. No other skull fracture identified. Sinuses/Orbits: Through and through fracture of the right frontal sinus (series 4, image 10) with a small volume of hemorrhage within the anterior frontal and ethmoid air cells. See also face CT reported separately. Tympanic cavities and mastoids appear clear. Other: Right forehead scalp hematoma with a small volume of subcutaneous gas likely escaping from the fractured anterior paranasal sinuses. Small volume right intraorbital gas also. Underlying chronic postoperative changes to the right globe. See face CT reported separately. Elsewhere scalp soft tissues appear normal. IMPRESSION: 1. Comminuted skull fractures through the frontal sinuses, right frontoethmoidal recess, and medial right orbit. See also face CT reported separately. 2. Normal noncontrast CT appearance of the brain. No pneumocephalus or intracranial hemorrhage identified. 3. Right forehead scalp hematoma with subcutaneous and intraorbital gas. Electronically Signed   By: Odessa Fleming M.D.   On: 09/02/2020 19:14   CT Cervical Spine Wo Contrast  Result Date: 09/02/2020 CLINICAL DATA:  69 year old female status post fall striking head. Right eye laceration. EXAM: CT CERVICAL SPINE WITHOUT CONTRAST TECHNIQUE: Multidetector CT imaging of the cervical spine was performed without intravenous contrast. Multiplanar CT image reconstructions were also generated. COMPARISON:  Head and face CT reported separately today. FINDINGS: Alignment: Mild straightening of cervical lordosis. Cervicothoracic junction alignment is within normal limits. Bilateral posterior element alignment is within normal limits. Skull base  and vertebrae: Visualized skull base is intact. No atlanto-occipital dissociation. No acute osseous abnormality identified in the cervical spine. Soft tissues and spinal canal: No prevertebral fluid or swelling. No visible canal hematoma. Negative visible noncontrast neck soft tissues aside from calcified  carotid atherosclerosis. Disc levels: Cervical spine degeneration with no cervical spinal stenosis suspected. Upper chest: Visible upper thoracic levels appear intact. Mild motion artifact at the lung apices which appear clear. Calcified aortic atherosclerosis. IMPRESSION: 1. No acute traumatic injury identified in the cervical spine. 2. See Face CT reported separately. Electronically Signed   By: Odessa Fleming M.D.   On: 09/02/2020 19:25   CT Knee Right Wo Contrast  Result Date: 09/02/2020 CLINICAL DATA:  Concern for tibial plateau fracture. EXAM: CT OF THE right KNEE WITHOUT CONTRAST TECHNIQUE: Multidetector CT imaging of the knee was performed according to the standard protocol. Multiplanar CT image reconstructions were also generated. COMPARISON:  X-ray right knee 09/02/2020. FINDINGS: Bones/Joint/Cartilage Subchondral cystic degenerative changes of lateral tibial plateau. Mild degenerative changes of the medial and lateral tibiofemoral compartments. No associated tibial plateau fracture. No acute displaced fracture of the bones of the right knee. Densely sclerotic lesion within the left femoral condyle likely represents a bone island. No right knee effusion or lipohemarthrosis. A Baker's cyst with neck originating between the medial head of the gastrocnemius and semi tendinosis tendon appears lobulated and measures up to at least 3.7 by 1.3 by 2.1 cm (3:30, 9:37). Ligaments Suboptimally assessed by CT. Muscles and Tendons Unremarkable. Soft tissues Unremarkable. No significant hematoma formation. No organized fluid collection. IMPRESSION: 1. No acute displaced fracture of the right knee in a patient with mild degenerative changes of the medial and lateral tibiofemoral compartment. 2. Non-perforated Baker's cyst. Electronically Signed   By: Tish Frederickson M.D.   On: 09/02/2020 20:43   DG Knee Complete 4 Views Right  Result Date: 09/02/2020 CLINICAL DATA:  Pain after a fall. EXAM: RIGHT KNEE - COMPLETE 4+  VIEW COMPARISON:  None. FINDINGS: Osseous irregularity about the tibial spines on the AP view. Equivocal posterior tibial plateau irregularity on the lateral view. Femur intact. 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 No joint effusion. IMPRESSION: Equivocal posterior tibial plateau osseous irregularity on lateral view. Likely degenerative osseous irregularity about the tibial spines on AP view. If high clinical concern of acute injury, consider CT to exclude nondisplaced tibial plateau fracture. Electronically Signed   By: Jeronimo Greaves M.D.   On: 09/02/2020 19:21   CT Maxillofacial WO CM  Result Date: 09/02/2020 CLINICAL DATA:  69 year old female status post fall striking head. Right eye laceration. EXAM: CT MAXILLOFACIAL WITHOUT CONTRAST TECHNIQUE: Multidetector CT imaging of the maxillofacial structures was performed. Multiplanar CT image reconstructions were also generated. COMPARISON:  Head CT today. FINDINGS: Osseous: Absent dentition. Mandible intact and normally located. Central skull base appears intact. Cervical vertebrae reported separately. No maxilla or nasal bone fracture identified. No zygoma or pterygoid plate fracture. Comminuted and mildly depressed fracture of the right frontal sinus (series 4, image 16) with associated fracture of the posterior sinus wall also (image 14), and oblique fracture plane tracking cephalad in the right frontal bone above the sinus (series 4, image 9). Associated comminution of the right frontoethmoidal recess (series 4, image 20) and associated mildly displaced fracture of the anterior right lamina papyracea. Contralateral anterior wall only left frontal sinus fracture (image 15). Orbits: Mildly displaced fracture of the anterior right lamina papyracea in association with the comminuted right frontoethmoidal recess. Mildly displaced  bone fragments into the superomedial orbit (series 9, image 17). The remaining right orbital walls are  intact. Trace right orbital gas in the extraconal space. Mild associated medial right orbit contusion (series 7, image 26). Superimposed chronic postoperative changes to the right globe. The left orbit walls and soft tissues are intact. Sinuses: Hemorrhage within the fractured frontal sinuses and anterior ethmoids. Maxillary, sphenoid, posterior ethmoid sinuses are clear. Tympanic cavities and mastoids are clear. Soft tissues: Negative visible noncontrast thyroid, larynx, pharynx, parapharyngeal spaces, retropharyngeal space, sublingual spaces, submandibular, masticator and parotid spaces. Calcified carotid atherosclerosis in the neck. No upper cervical lymphadenopathy. Limited intracranial: No intracranial gas or hemorrhage identified. Negative visible brain parenchyma, stable to the head CT. IMPRESSION: 1. Comminuted and mildly depressed fracture of the right frontal sinus. Fracture through the posterior wall of the sinus, into the right frontoethmoidal recess and the medial right orbit. Nondisplaced fracture of the anterior wall left frontal sinus. 2. Mildly displaced bone fragments in the superomedial right orbit with mild contusion and trace extraconal gas. 3. Negative visible brain parenchyma. Cervical sp toggle ine CT today reported separately. Electronically Signed   By: Odessa FlemingH  Hall M.D.   On: 09/02/2020 19:23       Subjective: Patient seen and examined at bedside along with ENT/Dr. Pollyann Kennedyosen spoke to the patient's son/family on phone as well.  Complains of some mild pain and swelling around the right eye and forehead.  No overnight fever or vomiting reported.  Discharge Exam: Vitals:   09/03/20 0608 09/03/20 0935  BP: (!) 161/92 137/78  Pulse: 67 78  Resp: (!) 21 18  Temp: 98.1 F (36.7 C) 98.8 F (37.1 C)  SpO2: 96% 99%    General: Pt is alert, awake, not in acute distress. Head: Laceration in the lower frontal scalp area which has been sutured with some bruising and swelling around the right  eye  Cardiovascular: rate controlled, S1/S2 + Respiratory: bilateral decreased breath sounds at bases Abdominal: Soft, NT, ND, bowel sounds + Extremities: no lower extremity edema, no cyanosis    The results of significant diagnostics from this hospitalization (including imaging, microbiology, ancillary and laboratory) are listed below for reference.     Microbiology: Recent Results (from the past 240 hour(s))  Resp Panel by RT-PCR (Flu A&B, Covid) Nasopharyngeal Swab     Status: None   Collection Time: 09/02/20 10:32 PM   Specimen: Nasopharyngeal Swab; Nasopharyngeal(NP) swabs in vial transport medium  Result Value Ref Range Status   SARS Coronavirus 2 by RT PCR NEGATIVE NEGATIVE Final    Comment: (NOTE) SARS-CoV-2 target nucleic acids are NOT DETECTED.  The SARS-CoV-2 RNA is generally detectable in upper respiratory specimens during the acute phase of infection. The lowest concentration of SARS-CoV-2 viral copies this assay can detect is 138 copies/mL. A negative result does not preclude SARS-Cov-2 infection and should not be used as the sole basis for treatment or other patient management decisions. A negative result may occur with  improper specimen collection/handling, submission of specimen other than nasopharyngeal swab, presence of viral mutation(s) within the areas targeted by this assay, and inadequate number of viral copies(<138 copies/mL). A negative result must be combined with clinical observations, patient history, and epidemiological information. The expected result is Negative.  Fact Sheet for Patients:  BloggerCourse.comhttps://www.fda.gov/media/152166/download  Fact Sheet for Healthcare Providers:  SeriousBroker.ithttps://www.fda.gov/media/152162/download  This test is no t yet approved or cleared by the [place] FDA and  has been authorized for detection and/or diagnosis of SARS-CoV-2 by FDA  under an Emergency Use Authorization (EUA). This EUA will remain  in effect (meaning this  test can be used) for the duration of the COVID-19 declaration under Section 564(b)(1) of the Act, 21 U.S.C.section 360bbb-3(b)(1), unless the authorization is terminated  or revoked sooner.       Influenza A by PCR NEGATIVE NEGATIVE Final   Influenza B by PCR NEGATIVE NEGATIVE Final    Comment: (NOTE) The Xpert Xpress SARS-CoV-2/FLU/RSV plus assay is intended as an aid in the diagnosis of influenza from Nasopharyngeal swab specimens and should not be used as a sole basis for treatment. Nasal washings and aspirates are unacceptable for Xpert Xpress SARS-CoV-2/FLU/RSV testing.  Fact Sheet for Patients: BloggerCourse.com  Fact Sheet for Healthcare Providers: SeriousBroker.it  This test is not yet approved or cleared by the [place] FDA and has been authorized for detection and/or diagnosis of SARS-CoV-2 by FDA under an Emergency Use Authorization (EUA). This EUA will remain in effect (meaning this test can be used) for the duration of the COVID-19 declaration under Section 564(b)(1) of the Act, 21 U.S.C. section 360bbb-3(b)(1), unless the authorization is terminated or revoked.  Performed at [facility], [address] [place], [place] [address]      Labs: BNP (last 3 results) No results for input(s): BNP in the last 8760 hours. Basic Metabolic Panel: Recent Labs  Lab 09/02/20 2112 09/03/20 0500  NA 135 135  K 3.4* 3.9  CL 101 100  CO2 24 24  GLUCOSE 130* 132*  BUN 13 11  CREATININE 0.84 0.79  CALCIUM 9.3 9.4   Liver Function Tests: No results for input(s): AST, ALT, ALKPHOS, BILITOT, PROT, ALBUMIN in the last 168 hours. No results for input(s): LIPASE, AMYLASE in the last 168 hours. No results for input(s): AMMONIA in the last 168 hours. CBC: Recent Labs  Lab 09/02/20 2112 09/03/20 0500  WBC 11.0* 9.8  NEUTROABS 9.0*  --   HGB 12.5 11.9*  HCT 38.9 37.2  MCV 82.8 83.2  PLT 322  296   Cardiac Enzymes: No results for input(s): CKTOTAL, CKMB, CKMBINDEX, TROPONINI in the last 168 hours. BNP: Invalid input(s): POCBNP CBG: Recent Labs  Lab 09/03/20 0918  GLUCAP 119*   D-Dimer No results for input(s): DDIMER in the last 72 hours. Hgb A1c No results for input(s): HGBA1C in the last 72 hours. Lipid Profile No results for input(s): CHOL, HDL, LDLCALC, TRIG, CHOLHDL, LDLDIRECT in the last 72 hours. Thyroid function studies No results for input(s): TSH, T4TOTAL, T3FREE, THYROIDAB in the last 72 hours.  Invalid input(s): FREET3 Anemia work up No results for input(s): VITAMINB12, FOLATE, FERRITIN, TIBC, IRON, RETICCTPCT in the last 72 hours. Urinalysis No results found for: COLORURINE, APPEARANCEUR, LABSPEC, PHURINE, GLUCOSEU, HGBUR, BILIRUBINUR, KETONESUR, PROTEINUR, UROBILINOGEN, NITRITE, LEUKOCYTESUR Sepsis Labs Invalid input(s): PROCALCITONIN,  WBC,  LACTICIDVEN Microbiology Recent Results (from the past 240 hour(s))  Resp Panel by RT-PCR (Flu A&B, Covid) Nasopharyngeal Swab     Status: None   Collection Time: 09/02/20 10:32 PM   Specimen: Nasopharyngeal Swab; Nasopharyngeal(NP) swabs in vial transport medium  Result Value Ref Range Status   SARS Coronavirus 2 by RT PCR NEGATIVE NEGATIVE Final    Comment: (NOTE) SARS-CoV-2 target nucleic acids are NOT DETECTED.  The SARS-CoV-2 RNA is generally detectable in upper respiratory specimens during the acute phase of infection. The lowest concentration of SARS-CoV-2 viral copies this assay can detect is 138 copies/mL. A negative result does not preclude SARS-Cov-2 infection and should not be  used as the sole basis for treatment or other patient management decisions. A negative result may occur with  improper specimen collection/handling, submission of specimen other than nasopharyngeal swab, presence of viral mutation(s) within the areas targeted by this assay, and inadequate number of viral copies(<138  copies/mL). A negative result must be combined with clinical observations, patient history, and epidemiological information. The expected result is Negative.  Fact Sheet for Patients:  BloggerCourse.com  Fact Sheet for Healthcare Providers:  SeriousBroker.it  This test is no t yet approved or cleared by the [place] FDA and  has been authorized for detection and/or diagnosis of SARS-CoV-2 by FDA under an Emergency Use Authorization (EUA). This EUA will remain  in effect (meaning this test can be used) for the duration of the COVID-19 declaration under Section 564(b)(1) of the Act, 21 U.S.C.section 360bbb-3(b)(1), unless the authorization is terminated  or revoked sooner.       Influenza A by PCR NEGATIVE NEGATIVE Final   Influenza B by PCR NEGATIVE NEGATIVE Final    Comment: (NOTE) The Xpert Xpress SARS-CoV-2/FLU/RSV plus assay is intended as an aid in the diagnosis of influenza from Nasopharyngeal swab specimens and should not be used as a sole basis for treatment. Nasal washings and aspirates are unacceptable for Xpert Xpress SARS-CoV-2/FLU/RSV testing.  Fact Sheet for Patients: BloggerCourse.com  Fact Sheet for Healthcare Providers: SeriousBroker.it  This test is not yet approved or cleared by the [place] FDA and has been authorized for detection and/or diagnosis of SARS-CoV-2 by FDA under an Emergency Use Authorization (EUA). This EUA will remain in effect (meaning this test can be used) for the duration of the COVID-19 declaration under Section 564(b)(1) of the Act, 21 U.S.C. section 360bbb-3(b)(1), unless the authorization is terminated or revoked.  Performed at [facility], [address], [place], [place] [address]      Time coordinating discharge: 35 minutes  SIGNED:   Glade Lloyd, MD  [facility] 09/03/2020, 10:54  AM

## 2020-09-03 NOTE — ED Notes (Signed)
[facility] called for transport to [facility] 

## 2020-09-03 NOTE — Discharge Instructions (Signed)
Skull Fracture, Adult  A skull fracture is a break or crack in one of the bones that make up the skull. Skull fractures range in severity. A skull fracture is more severe if the bones move out of place after the fracture, or if the brain, spine, nerves, or nearby blood vessels are also injured. A skull fracture is an emergency and needs immediate medical attention. What are the causes? This condition is usually caused by a forceful injury to the head, such as from:  A fall.  An assault.  A hard, direct hit to the head.  A car crash or a recreational vehicle crash. What are the signs or symptoms? Symptoms of a skull fracture depend on what type of injury caused the fracture and how severe the injury is. Symptoms may include:  A headache.  Pain, swelling, or an indent in one area of the head or scalp.  Clear or bloody liquid leaking from the nose or ears.  Blurred or double vision.  Slurred speech.  Nausea or vomiting.  Bruising around the eyes or behind the ears.  Weakness or numbness in the face or in one side or area of the body.  A sudden loss of hearing or smell.  Confusion.  Trouble with balance or coordination.  Seizures. How is this diagnosed? This condition is diagnosed based on:  A physical exam and your medical history.  Tests, such as: ? CT scan. ? X-rays. ? MRI. ? A hearing test and an eye exam. ? A nerve test. This may be done to check for any damage to your facial nerves. If clear or bloody liquid is leaking from your nose or ears, it may be tested to check if it is cerebrospinal fluid, which is the type of fluid that surrounds the brain and spinal cord. How is this treated? Most skull fractures heal without treatment. If treatment is needed, it may include:  Observation and rest. You may be admitted to a hospital for close observation.  Medicines. These may be given to relieve symptoms such as headaches, seizures, and nausea.  Antibiotic  medicines, if you have a scalp wound. If you have a severe fracture, you may need surgery to correct the position of any bones that have moved. Surgery may also be needed to treat injuries to other areas of the head, spine, and face. Follow these instructions at home: Medicines  Take over-the-counter and prescription medicines only as told by your health care provider.  If you were prescribed an antibiotic medicine, take it as told by your health care provider. Do not stop taking the antibiotic even if you start to feel better. Wound care   Follow instructions from your health care provider about how to take care of your wound. Make sure you: ? Wash your hands with soap and water before and after you change your bandage (dressing). If soap and water are not available, use hand sanitizer. ? Change your dressing as told by your health care provider. ? If you have stitches (sutures), staples, skin glue, or adhesive strips, leave them in place. These skin closures may need to stay in place for 2 weeks or longer. If adhesive strip edges start to loosen and curl up, you may trim the loose edges. Do not remove adhesive strips completely unless your health care provider tells you to do that. ? Check your wound every day for signs of infection. Check for:  More redness, swelling, or pain.  More fluid or blood.  Warmth.  Pus or a bad smell. Activity  Rest as told by your health care provider. Ask your health care provider when you can return to your normal activities.  Do not lift anything that is heavier than 10 lb (4.5 kg), or the limit that you are told, until your health care provider says that it is safe.  Do not drive or use heavy machinery until your health care provider says that it is safe. General instructions  If you were treated in the hospital, have someone stay with you when you go home. This person will need to watch you for a few days and make sure that you get medical care if  you have problems. Ask your health care provider how long someone should stay with you.  Do not drink alcohol until your health care provider says that you can.  Do not blow your nose until your health care provider says that it is okay.  Raise (elevate) your head above the level of your heart while you are lying down.  Keep all follow-up visits as told by your health care provider. This is important. Contact a health care provider if:  You feel nauseous.  You have ongoing (persistent) headaches that are not relieved by medicines.  Your symptoms do not go away.  Your wound appears infected or starts bleeding. Get help right away if:  You vomit more than once.  You feel confused.  You have trouble talking or walking.  You have seizures.  You are drowsy or have trouble waking up.  You lose consciousness.  Your eyes move back and forth rapidly.  You have a severe headache.  Your arms or legs do not move the way they should.  Your pupils change size much more than they normally do.  You have clear or bloody liquid coming from your nose or ears. These symptoms may represent a serious problem that is an emergency. Do not wait to see if the symptoms will go away. Get medical help right away. Call your local emergency services (911 in the [place] Do not drive yourself to the hospital. Summary  A skull fracture is a break or crack in one of the bones that make up the skull. This condition is an emergency and needs immediate medical attention.  A skull fracture is usually caused by a forceful injury to the head.  Most skull fractures heal without treatment. If treatment is needed, it may include rest, medicines, and surgery.  Keep all follow-up visits as told by your health care provider. This is important. This information is not intended to replace advice given to you by your health care provider. Make sure you discuss any questions you have with your health care  provider. Document Revised: 11/12/2018 Document Reviewed: 11/12/2018 Elsevier Patient Education  2020 [employer].    [place] [facility] [place] [facility] (pp. [phone]). [place], [place]. Elsevier."> Neurosurgery, 80(1), 6-15. Retrieved on February 28, 2019.[email or web address]"> Primary Care (5th ed., pp. 218-221). [place], [place]: Elsevier."> Brain Injury, 29(6), [phone]. [email or web address]"> Rosen's Emergency Medicine: Concepts and Clinical Practice (9th ed., pp. 301-329). [place], [place]: Elsevier.">  Head Injury, Adult There are many types of head injuries. Head injuries can be as minor as a bump, or they can be a serious medical issue. More severe head injuries include:  A jarring injury to the brain (concussion).  A bruise (contusion) of the brain. This means there is bleeding in the brain that can cause swelling.  A cracked skull (skull fracture).  Bleeding in the brain that collects, clots, and forms a bump (hematoma). After a head injury, most problems occur within the first 24 hours, but side effects may occur up to 7-10 days after the injury. It is important to watch your condition for any changes. You may need to be observed in the emergency department or urgent care, or you may be admitted to the hospital. What are the causes? There are many possible causes of a head injury. A serious head injury may be caused by a car accident, bicycle or motorcycle accidents, sports injuries, and falls. What are the symptoms? Symptoms of a head injury include a contusion, bump, or bleeding at the site of the injury. Other physical symptoms may include:  Headache.  Nausea or vomiting.  Dizziness.  Feeling tired.  Being uncomfortable around bright lights or loud noises.  Seizures.  Trouble being awakened.  Fainting. Mental or emotional symptoms may include:  Irritability.  Confusion and memory problems.  Poor attention and  concentration.  Changes in eating or sleeping habits.  Anxiety or depression. How is this diagnosed? This condition can usually be diagnosed based on your symptoms, a description of the injury, and a physical exam. You may also have imaging tests done, such as a CT scan or MRI. How is this treated? Treatment for this condition depends on the severity and type of injury you have. The main goal of treatment is to prevent complications and to allow the brain time to heal. Mild head injury If you have a mild head injury, you may be sent home and treatment may include:  Observation. A responsible adult should stay with you for 24 hours after your injury and check on you often.  Physical rest.  Brain rest.  Pain medicines. Severe head injury If you have a severe head injury, treatment may include:  Close observation. This includes hospitalization with frequent physical exams.  Medicines to relieve pain, prevent seizures, and decrease brain swelling.  Breathing support. This may include using a ventilator.  Treatments to manage the swelling inside the brain.  Brain surgery. This may be needed to: ? Remove a blood clot. ? Stop the bleeding. ? Remove a part of the skull to allow room for the brain to swell. Follow these instructions at home: Activity  Rest and avoid activities that are physically hard or tiring.  Make sure you get enough sleep.  Limit activities that require a lot of thought or attention, such as: ? Watching TV. ? Playing memory games and puzzles. ? Job-related work or homework. ? Working on [employer], and texting.  Avoid activities that could cause another head injury, such as playing sports, until your health care provider approves. Having another head injury, especially before the first one has healed, can be dangerous.  Ask your health care provider when it is safe for you to return to your regular activities, including work or  school. Ask your health care provider for a step-by-step plan for gradually returning to activities.  Ask your health care provider when you can drive, ride a bicycle, or use heavy machinery. Your ability to react may be slower after a brain injury. Do not do these activities if you are dizzy. Lifestyle   Do not drink alcohol until your health care provider approves. Do not use drugs. Alcohol and certain drugs may slow your recovery and can put you at risk of further injury.  If it is harder than usual to remember things, write  them down.  If you are easily distracted, try to do one thing at a time.  Talk with family members or close friends when making important decisions.  Tell your friends, family, a trusted colleague, and work Production designer, theatre/television/film about your injury, symptoms, and restrictions. Have them watch for any new or worsening problems. General instructions  Take over-the-counter and prescription medicines only as told by your health care provider.  Have someone stay with you for 24 hours after your head injury. This person should watch you for any changes in your symptoms and be ready to seek medical help.  Keep all follow-up visits as told by your health care provider. This is important. How is this prevented?  Work on improving your balance and strength to avoid falls.  Wear a seatbelt when you are in a moving vehicle.  Wear a helmet when riding a bicycle, skiing, or doing any other sport or activity that has a risk of injury.  If you drink alcohol: ? Limit how much you use to:  0-1 drink a day for women.  0-2 drinks a day for men. ? Be aware of how much alcohol is in your drink. In the [place] one drink equals one 12 oz bottle of beer (355 mL), one 5 oz glass of wine (148 mL), or one 1 oz glass of hard liquor (44 mL).  Take safety measures in your home, such as: ? Removing clutter and tripping hazards from floors and stairways. ? Using grab bars in bathrooms and handrails by  stairs. ? Placing non-slip mats on floors and in bathtubs. ? Improving lighting in dim areas. Get help right away if:  You have: ? A severe headache that is not helped by medicine. ? Trouble walking or weakness in your arms and legs. ? Clear or bloody fluid coming from your nose or ears. ? Changes in your vision. ? A seizure.  You lose your balance.  You vomit.  Your pupils change size.  Your speech is slurred.  Your dizziness gets worse.  You faint.  You are sleepier than normal and have trouble staying awake.  Your symptoms get worse. These symptoms may represent a serious problem that is an emergency. Do not wait to see if the symptoms will go away. Get medical help right away. Call your local emergency services (911 in the [place] Do not drive yourself to the hospital. Summary  Head injuries can be minor or they can be a serious medical issue requiring immediate attention.  Treatment for this condition depends on the severity and type of injury you have.  Ask your health care provider when it is safe for you to return to your regular activities, including work or school.  Head injury prevention includes wearing a seat belt in a motor vehicle, using a helmet on a bicycle, limiting alcohol use, and taking safety measures in your home. This information is not intended to replace advice given to you by your health care provider. Make sure you discuss any questions you have with your health care provider. Document Revised: 09/23/2018 Document Reviewed: 09/18/2018 Elsevier Patient Education  2020 Elsevier Inc.   Concussion, Adult  A concussion is a brain injury from a hard, direct hit (trauma) to your head or body. This direct hit causes the brain to quickly shake back and forth inside the skull. A concussion may also be called a mild traumatic brain injury (TBI). Healing from this injury can take time. What are the causes? This condition is caused  by: A direct hit to  your head, such as: Running into a player during a game. Being hit in a fight. Hitting your head on a hard surface. A quick and sudden movement (jolt) of the head or neck, such as in a car crash. What are the signs or symptoms? The signs of a concussion can be hard to notice. They may be missed by you, family members, and doctors. You may look fine on the outside but may not act or feel normal. Physical symptoms Headaches. Being tired (fatigued). Being dizzy. Problems with body balance. Problems seeing or hearing. Being sensitive to light or noise. Feeling sick to your stomach (nausea) or throwing up (vomiting). Not sleeping or eating as you used to. Loss of feeling (numbness) or tingling in the body. Seizure. Mental and emotional symptoms Problems remembering things. Trouble focusing your mind (concentrating), organizing, or making decisions. Being slow to think, act, react, speak, or read. Feeling grouchy (irritable). Having mood changes. Feeling worried or nervous (anxious). Feeling sad (depressed). How is this treated? This condition may be treated by: Stopping sports or activity if you are injured. If you hit your head or have signs of concussion: Do not return to sports or activities the same day. Get checked by a doctor before you return to your activities. Resting your body and your mind. Being watched carefully, often at home. Medicines to help with symptoms such as: Feeling sick to your stomach. Headaches. Problems with sleep. Avoid taking strong pain medicines (opioids) for a concussion. Avoiding alcohol and drugs. Being asked to go to a concussion clinic or a place to help you recover (rehabilitation center). Recovery from a concussion can take time. Return to activities only: When you are fully healed. When your doctor says it is safe. Follow these instructions at home: Activity Limit activities that need a lot of thought or focus, such as: Homework or work  for your job. Watching TV. Using the computer or phone. Playing memory games and puzzles. Rest. Rest helps your brain heal. Make sure you: Get plenty of sleep. Most adults should get 7-9 hours of sleep each night. Rest during the day. Take naps or breaks when you feel tired. Avoid activity like exercise until your doctor says its safe. Stop any activity that makes symptoms worse. Do not do activities that could cause a second concussion, such as riding a bike or playing sports. Ask your doctor when you can return to your normal activities, such as school, work, sports, and driving. Your ability to react may be slower. Do not do these activities if you are dizzy. General instructions  Take over-the-counter and prescription medicines only as told by your doctor. Do not drink alcohol until your doctor says you can. Watch your symptoms and tell other people to do the same. Other problems can occur after a concussion. Older adults have a higher risk of serious problems. Tell your work Production designer, theatre/television/filmmanager, teachers, Tax adviserschool nurse, school counselor, coach, or Event organiserathletic trainer about your injury and symptoms. Tell them about what you can or cannot do. Keep all follow-up visits as told by your doctor. This is important. How is this prevented? It is very important that you do not get another brain injury. In rare cases, another injury can cause brain damage that will not go away, brain swelling, or death. The risk of this is greatest in the first 7-10 days after a head injury. To avoid injuries: Stop activities that could lead to a second concussion, such as contact  sports, until your doctor says it is okay. When you return to sports or activities: Do not crash into other players. This is how most concussions happen. Follow the rules. Respect other players. Get regular exercise. Do strength and balance training. Wear a helmet that fits you well during sports, biking, or other activities. Helmets can help protect  you from serious skull and brain injuries, but they do not protect you from a concussion. Even when wearing a helmet, you should avoid being hit in the head. Contact a doctor if: Your symptoms get worse or they do not get better. You have new symptoms. You have another injury. Get help right away if: You have bad headaches or your headaches get worse. You feel weak or numb in any part of your body. You are mixed up (confused). Your balance gets worse. You keep throwing up. You feel more sleepy than normal. Your speech is not clear (is slurred). You cannot recognize people or places. You have a seizure. Others have trouble waking you up. You have behavior changes. You have changes in how you see (vision). You pass out (lose consciousness). Summary A concussion is a brain injury from a hard, direct hit (trauma) to your head or body. This condition is treated with rest and careful watching of symptoms. If you keep having symptoms, call your doctor. This information is not intended to replace advice given to you by your health care provider. Make sure you discuss any questions you have with your health care provider. Document Revised: 04/16/2018 Document Reviewed: 04/16/2018 Elsevier Patient Education  2020 [employer].  Return to the emergency department immediately if there is any high fever, worsening headache, stiff neck, change in mental status.  Apply antibiotic ointment to the laceration twice daily.  Avoid nose blowing for 2 weeks.

## 2020-09-03 NOTE — ED Notes (Signed)
Initial contact with pt. Pt is sleeping at bedside, easily awakened. On monitor x4 awaiting bed assignment at [facility].

## 2020-09-03 NOTE — ED Notes (Signed)
5N reports pt is not appropriate for their floor and bed placement will contact this RN with a new bed placement.

## 2020-09-03 NOTE — Consult Note (Signed)
Reason for Consult: Frontal sinus fracture Referring Physician: Glade Lloyd, MD  Connie Schmidt is an 69 y.o. female.  HPI: Patient fell last night on her face.  She is here visiting her family she lives in [place].  She is not on blood thinner.  Past Medical History:  Diagnosis Date  . Hypertension   . MI (myocardial infarction) (HCC)     History reviewed. No pertinent surgical history.  No family history on file.  Social History:  has no history on file for tobacco use, alcohol use, and drug use.  Allergies:  Allergies  Allergen Reactions  . T-Pa [Alteplase]     Blindness    Medications: Reviewed  Results for orders placed or performed during the hospital encounter of 09/02/20 (from the past 48 hour(s))  Basic metabolic panel     Status: Abnormal   Collection Time: 09/02/20  9:12 PM  Result Value Ref Range   Sodium 135 135 - 145 mmol/L   Potassium 3.4 (L) 3.5 - 5.1 mmol/L   Chloride 101 98 - 111 mmol/L   CO2 24 22 - 32 mmol/L   Glucose, Bld 130 (H) 70 - 99 mg/dL    Comment: Glucose reference range applies only to samples taken after fasting for at least 8 hours.   BUN 13 8 - 23 mg/dL   Creatinine, Ser 4.19 0.44 - 1.00 mg/dL   Calcium 9.3 8.9 - 62.2 mg/dL   GFR, Estimated >29 >79 mL/min    Comment: (NOTE) Calculated using the CKD-EPI Creatinine Equation (2021)    Anion gap 10 5 - 15    Comment: Performed at [facility], [address], [place], [place] [address]  CBC with Differential     Status: Abnormal   Collection Time: 09/02/20  9:12 PM  Result Value Ref Range   WBC 11.0 (H) 4.0 - 10.5 K/uL   RBC 4.70 3.87 - 5.11 MIL/uL   Hemoglobin 12.5 12.0 - 15.0 g/dL   HCT 94.1 74.0 - 81.4 %   MCV 82.8 80.0 - 100.0 fL   MCH 26.6 26.0 - 34.0 pg   MCHC 32.1 30.0 - 36.0 g/dL   RDW 48.1 85.6 - 31.4 %   Platelets 322 150 - 400 K/uL   nRBC 0.0 0.0 - 0.2 %   Neutrophils Relative % 81 %   Neutro Abs 9.0 (H) 1.7 - 7.7 K/uL   Lymphocytes Relative 13 %    Lymphs Abs 1.4 0.7 - 4.0 K/uL   Monocytes Relative 5 %   Monocytes Absolute 0.5 0.1 - 1.0 K/uL   Eosinophils Relative 1 %   Eosinophils Absolute 0.1 0.0 - 0.5 K/uL   Basophils Relative 0 %   Basophils Absolute 0.0 0.0 - 0.1 K/uL   Immature Granulocytes 0 %   Abs Immature Granulocytes 0.04 0.00 - 0.07 K/uL    Comment: Performed at [facility], [address] [place], [place] [address]  Resp Panel by RT-PCR (Flu A&B, Covid) Nasopharyngeal Swab     Status: None   Collection Time: 09/02/20 10:32 PM   Specimen: Nasopharyngeal Swab; Nasopharyngeal(NP) swabs in vial transport medium  Result Value Ref Range   SARS Coronavirus 2 by RT PCR NEGATIVE NEGATIVE    Comment: (NOTE) SARS-CoV-2 target nucleic acids are NOT DETECTED.  The SARS-CoV-2 RNA is generally detectable in upper respiratory specimens during the acute phase of infection. The lowest concentration of SARS-CoV-2 viral copies this assay can detect is 138 copies/mL. A negative  result does not preclude SARS-Cov-2 infection and should not be used as the sole basis for treatment or other patient management decisions. A negative result may occur with  improper specimen collection/handling, submission of specimen other than nasopharyngeal swab, presence of viral mutation(s) within the areas targeted by this assay, and inadequate number of viral copies(<138 copies/mL). A negative result must be combined with clinical observations, patient history, and epidemiological information. The expected result is Negative.  Fact Sheet for Patients:  BloggerCourse.comhttps://www.fda.gov/media/152166/download  Fact Sheet for Healthcare Providers:  SeriousBroker.ithttps://www.fda.gov/media/152162/download  This test is no t yet approved or cleared by the [place] FDA and  has been authorized for detection and/or diagnosis of SARS-CoV-2 by FDA under an Emergency Use Authorization (EUA). This EUA will remain  in effect (meaning this test can be used)  for the duration of the COVID-19 declaration under Section 564(b)(1) of the Act, 21 U.S.C.section 360bbb-3(b)(1), unless the authorization is terminated  or revoked sooner.       Influenza A by PCR NEGATIVE NEGATIVE   Influenza B by PCR NEGATIVE NEGATIVE    Comment: (NOTE) The Xpert Xpress SARS-CoV-2/FLU/RSV plus assay is intended as an aid in the diagnosis of influenza from Nasopharyngeal swab specimens and should not be used as a sole basis for treatment. Nasal washings and aspirates are unacceptable for Xpert Xpress SARS-CoV-2/FLU/RSV testing.  Fact Sheet for Patients: BloggerCourse.comhttps://www.fda.gov/media/152166/download  Fact Sheet for Healthcare Providers: SeriousBroker.ithttps://www.fda.gov/media/152162/download  This test is not yet approved or cleared by the [place] FDA and has been authorized for detection and/or diagnosis of SARS-CoV-2 by FDA under an Emergency Use Authorization (EUA). This EUA will remain in effect (meaning this test can be used) for the duration of the COVID-19 declaration under Section 564(b)(1) of the Act, 21 U.S.C. section 360bbb-3(b)(1), unless the authorization is terminated or revoked.  Performed at [facility], [address] [place], [place] [address]   Basic metabolic panel     Status: Abnormal   Collection Time: 09/03/20  5:00 AM  Result Value Ref Range   Sodium 135 135 - 145 mmol/L   Potassium 3.9 3.5 - 5.1 mmol/L   Chloride 100 98 - 111 mmol/L   CO2 24 22 - 32 mmol/L   Glucose, Bld 132 (H) 70 - 99 mg/dL    Comment: Glucose reference range applies only to samples taken after fasting for at least 8 hours.   BUN 11 8 - 23 mg/dL   Creatinine, Ser 1.910.79 0.44 - 1.00 mg/dL   Calcium 9.4 8.9 - 47.810.3 mg/dL   GFR, Estimated >29>60 >56>60 mL/min    Comment: (NOTE) Calculated using the CKD-EPI Creatinine Equation (2021)    Anion gap 11 5 - 15    Comment: Performed at [facility], [address] [place], [place] [address]   CBC     Status: Abnormal   Collection Time: 09/03/20  5:00 AM  Result Value Ref Range   WBC 9.8 4.0 - 10.5 K/uL   RBC 4.47 3.87 - 5.11 MIL/uL   Hemoglobin 11.9 (L) 12.0 - 15.0 g/dL   HCT 65.737.2 84.636.0 - 96.246.0 %   MCV 83.2 80.0 - 100.0 fL   MCH 26.6 26.0 - 34.0 pg   MCHC 32.0 30.0 - 36.0 g/dL   RDW 95.214.1 84.111.5 - 32.415.5 %   Platelets 296 150 - 400 K/uL   nRBC 0.0 0.0 - 0.2 %    Comment: Performed at [facility], [address] [place], [place]  [address]  Protime-INR     Status: None   Collection Time: 09/03/20  5:00 AM  Result Value Ref Range   Prothrombin Time 14.2 11.4 - 15.2 seconds   INR 1.1 0.8 - 1.2    Comment: (NOTE) INR goal varies based on device and disease states. Performed at [facility], [address] [place], [place] [address]   APTT     Status: None   Collection Time: 09/03/20  5:00 AM  Result Value Ref Range   aPTT 26 24 - 36 seconds    Comment: Performed at [facility], [address], [place], [place] [address]  CBG monitoring, ED     Status: Abnormal   Collection Time: 09/03/20  9:18 AM  Result Value Ref Range   Glucose-Capillary 119 (H) 70 - 99 mg/dL    Comment: Glucose reference range applies only to samples taken after fasting for at least 8 hours.    DG Wrist Complete Right  Result Date: 09/02/2020 CLINICAL DATA:  Pain after a fall EXAM: RIGHT WRIST - COMPLETE 3+ VIEW COMPARISON:  None. FINDINGS: Degenerative changes about the radiocarpal articulation. No acute fracture or dislocation. Scaphoid intact. IMPRESSION: Degenerative change, without acute osseous finding. Electronically Signed   By: Jeronimo Greaves M.D.   On: 09/02/2020 19:27   CT Head Wo Contrast  Result Date: 09/02/2020 CLINICAL DATA:  69 year old female status post fall striking head. Right eye laceration. EXAM: CT HEAD WITHOUT CONTRAST TECHNIQUE: Contiguous axial images were obtained from the base of the skull through the vertex without  intravenous contrast. COMPARISON:  Face CT reported separately. FINDINGS: Brain: Despite frontal bone fractures no pneumocephalus identified. No midline shift, ventriculomegaly, mass effect, evidence of mass lesion, intracranial hemorrhage or evidence of cortically based acute infarction. Gray-white matter differentiation is within normal limits throughout the brain. Vascular: Mild Calcified atherosclerosis at the skull base. No suspicious intracranial vascular hyperdensity. Skull: Comminuted, through and through fracture of the right frontal sinus (series 4, image 10) and right frontoethmoidal recess. Associated fracture of the anterior right lamina papyracea. Anterior wall left frontal sinus fracture on series 4, image 9. See also face CT. No other skull fracture identified. Sinuses/Orbits: Through and through fracture of the right frontal sinus (series 4, image 10) with a small volume of hemorrhage within the anterior frontal and ethmoid air cells. See also face CT reported separately. Tympanic cavities and mastoids appear clear. Other: Right forehead scalp hematoma with a small volume of subcutaneous gas likely escaping from the fractured anterior paranasal sinuses. Small volume right intraorbital gas also. Underlying chronic postoperative changes to the right globe. See face CT reported separately. Elsewhere scalp soft tissues appear normal. IMPRESSION: 1. Comminuted skull fractures through the frontal sinuses, right frontoethmoidal recess, and medial right orbit. See also face CT reported separately. 2. Normal noncontrast CT appearance of the brain. No pneumocephalus or intracranial hemorrhage identified. 3. Right forehead scalp hematoma with subcutaneous and intraorbital gas. Electronically Signed   By: Odessa Fleming M.D.   On: 09/02/2020 19:14   CT Cervical Spine Wo Contrast  Result Date: 09/02/2020 CLINICAL DATA:  69 year old female status post fall striking head. Right eye laceration. EXAM: CT CERVICAL  SPINE WITHOUT CONTRAST TECHNIQUE: Multidetector CT imaging of the cervical spine was performed without intravenous contrast. Multiplanar CT image reconstructions were also generated. COMPARISON:  Head and face CT reported separately today. FINDINGS: Alignment: Mild straightening of cervical lordosis. Cervicothoracic junction alignment is within normal limits. Bilateral posterior element alignment  is within normal limits. Skull base and vertebrae: Visualized skull base is intact. No atlanto-occipital dissociation. No acute osseous abnormality identified in the cervical spine. Soft tissues and spinal canal: No prevertebral fluid or swelling. No visible canal hematoma. Negative visible noncontrast neck soft tissues aside from calcified carotid atherosclerosis. Disc levels: Cervical spine degeneration with no cervical spinal stenosis suspected. Upper chest: Visible upper thoracic levels appear intact. Mild motion artifact at the lung apices which appear clear. Calcified aortic atherosclerosis. IMPRESSION: 1. No acute traumatic injury identified in the cervical spine. 2. See Face CT reported separately. Electronically Signed   By: Odessa Fleming M.D.   On: 09/02/2020 19:25   CT Knee Right Wo Contrast  Result Date: 09/02/2020 CLINICAL DATA:  Concern for tibial plateau fracture. EXAM: CT OF THE right KNEE WITHOUT CONTRAST TECHNIQUE: Multidetector CT imaging of the knee was performed according to the standard protocol. Multiplanar CT image reconstructions were also generated. COMPARISON:  X-ray right knee 09/02/2020. FINDINGS: Bones/Joint/Cartilage Subchondral cystic degenerative changes of lateral tibial plateau. Mild degenerative changes of the medial and lateral tibiofemoral compartments. No associated tibial plateau fracture. No acute displaced fracture of the bones of the right knee. Densely sclerotic lesion within the left femoral condyle likely represents a bone island. No right knee effusion or lipohemarthrosis. A  Baker's cyst with neck originating between the medial head of the gastrocnemius and semi tendinosis tendon appears lobulated and measures up to at least 3.7 by 1.3 by 2.1 cm (3:30, 9:37). Ligaments Suboptimally assessed by CT. Muscles and Tendons Unremarkable. Soft tissues Unremarkable. No significant hematoma formation. No organized fluid collection. IMPRESSION: 1. No acute displaced fracture of the right knee in a patient with mild degenerative changes of the medial and lateral tibiofemoral compartment. 2. Non-perforated Baker's cyst. Electronically Signed   By: Tish Frederickson M.D.   On: 09/02/2020 20:43   DG Knee Complete 4 Views Right  Result Date: 09/02/2020 CLINICAL DATA:  Pain after a fall. EXAM: RIGHT KNEE - COMPLETE 4+ VIEW COMPARISON:  None. FINDINGS: Osseous irregularity about the tibial spines on the AP view. Equivocal posterior tibial plateau irregularity on the lateral view. Femur intact. 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 No joint effusion. IMPRESSION: Equivocal posterior tibial plateau osseous irregularity on lateral view. Likely degenerative osseous irregularity about the tibial spines on AP view. If high clinical concern of acute injury, consider CT to exclude nondisplaced tibial plateau fracture. Electronically Signed   By: Jeronimo Greaves M.D.   On: 09/02/2020 19:21   CT Maxillofacial WO CM  Result Date: 09/02/2020 CLINICAL DATA:  69 year old female status post fall striking head. Right eye laceration. EXAM: CT MAXILLOFACIAL WITHOUT CONTRAST TECHNIQUE: Multidetector CT imaging of the maxillofacial structures was performed. Multiplanar CT image reconstructions were also generated. COMPARISON:  Head CT today. FINDINGS: Osseous: Absent dentition. Mandible intact and normally located. Central skull base appears intact. Cervical vertebrae reported separately. No maxilla or nasal bone fracture identified. No zygoma or pterygoid plate fracture. Comminuted and  mildly depressed fracture of the right frontal sinus (series 4, image 16) with associated fracture of the posterior sinus wall also (image 14), and oblique fracture plane tracking cephalad in the right frontal bone above the sinus (series 4, image 9). Associated comminution of the right frontoethmoidal recess (series 4, image 20) and associated mildly displaced fracture of the anterior right lamina papyracea. Contralateral anterior wall only left frontal sinus fracture (image 15). Orbits: Mildly displaced fracture of the anterior right lamina papyracea in association with the  comminuted right frontoethmoidal recess. Mildly displaced bone fragments into the superomedial orbit (series 9, image 17). The remaining right orbital walls are intact. Trace right orbital gas in the extraconal space. Mild associated medial right orbit contusion (series 7, image 26). Superimposed chronic postoperative changes to the right globe. The left orbit walls and soft tissues are intact. Sinuses: Hemorrhage within the fractured frontal sinuses and anterior ethmoids. Maxillary, sphenoid, posterior ethmoid sinuses are clear. Tympanic cavities and mastoids are clear. Soft tissues: Negative visible noncontrast thyroid, larynx, pharynx, parapharyngeal spaces, retropharyngeal space, sublingual spaces, submandibular, masticator and parotid spaces. Calcified carotid atherosclerosis in the neck. No upper cervical lymphadenopathy. Limited intracranial: No intracranial gas or hemorrhage identified. Negative visible brain parenchyma, stable to the head CT. IMPRESSION: 1. Comminuted and mildly depressed fracture of the right frontal sinus. Fracture through the posterior wall of the sinus, into the right frontoethmoidal recess and the medial right orbit. Nondisplaced fracture of the anterior wall left frontal sinus. 2. Mildly displaced bone fragments in the superomedial right orbit with mild contusion and trace extraconal gas. 3. Negative visible  brain parenchyma. Cervical sp toggle ine CT today reported separately. Electronically Signed   By: Odessa Fleming M.D.   On: 09/02/2020 19:23    UYE:BXIDHWYS except as listed in admit H&P  Blood pressure (!) 161/92, pulse 67, temperature 98.1 F (36.7 C), temperature source Oral, resp. rate (!) 21, height 5' (1.524 m), weight 55.3 kg, SpO2 96 %.  PHYSICAL EXAM: Overall appearance:  Healthy appearing, in no distress Head: Dried blood in repair laceration of the lower frontal scalp area.  There is some bruising and swelling around the right eye but the globe itself looks healthy and her vision is grossly intact..  Due to the swelling the fracture is not easily palpable nor is the depression. Ears: External ears appear healthy. Nose: External nose is healthy in appearance. . Oral Cavity/Pharynx:  There are no mucosal lesions or masses identified. Larynx/Hypopharynx: Deferred Neuro:  No identifiable neurologic deficits. Neck: No palpable neck masses.  Studies Reviewed: Maxillofacial CT   Procedures: none   Assessment/Plan: Mildly displaced anterior wall frontal sinus fracture with slight depression.  There is a hairline fracture of the posterior table without displacement.  This should not influence the drainage pathway of the sinus.  There is complex laceration of the forehead scalp which has been repaired in the emergency department.  The anterior wall frontal sinus fracture is purely a cosmetic issue.  This can be repaired using open reduction internal fixation with microplates and screws.  She can have this done while here visiting in town or she can have this done in [place] when she gets back home.  I talked with her and her son over the phone.  I would like to hold off on any surgery for now.  I think that is reasonable.  Recommend antibiotic ointment to the lacerations twice daily.  Follow-up in my office during the week if there are any concerns or questions.  Be on the look out for any  signs of meningitis.  Avoid nose blowing.  Serena Colonel 09/03/2020, 9:26 AM

## 2021-08-14 IMAGING — CT CT HEAD W/O CM
3 series · 14 of 46 positions shown, 16 images · non-contrast
Comparison: Face CT reported separately.

CLINICAL DATA: 69-year-old female status post fall striking head.
Right eye laceration.

EXAM:
CT HEAD WITHOUT CONTRAST
TECHNIQUE: Contiguous axial images were obtained from the base of the skull
through the vertex without intravenous contrast.

[Series 3: head wo · axial · 0.42mm/px · z∈[-83,+37]mm · 8 of 29 slices shown, 10 images]
[im 3/29  brain]
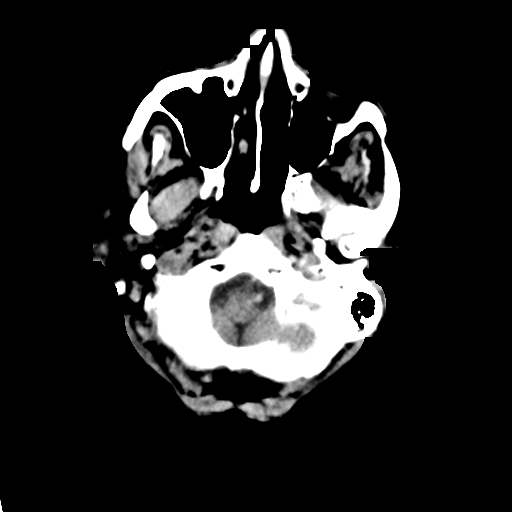
[im 3/29  bone]
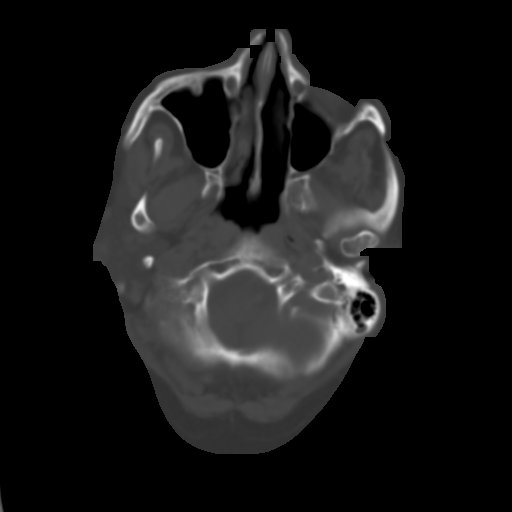
[im 7/29  brain]
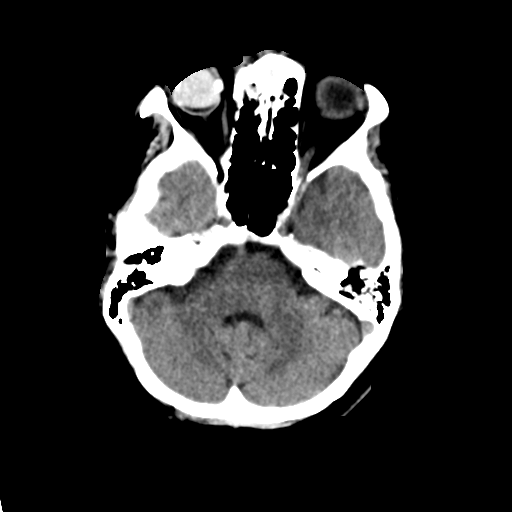
[im 10/29  brain]
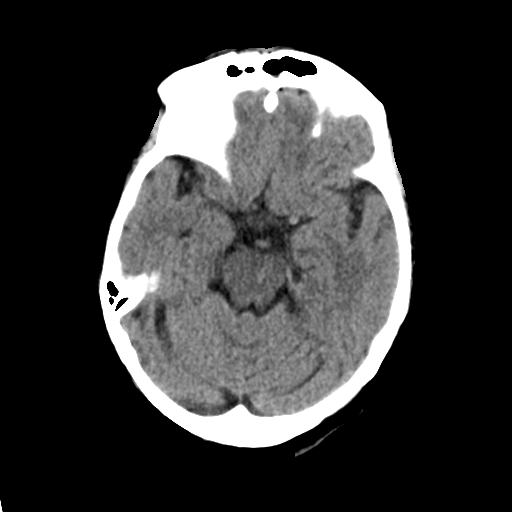
[im 13/29  brain]
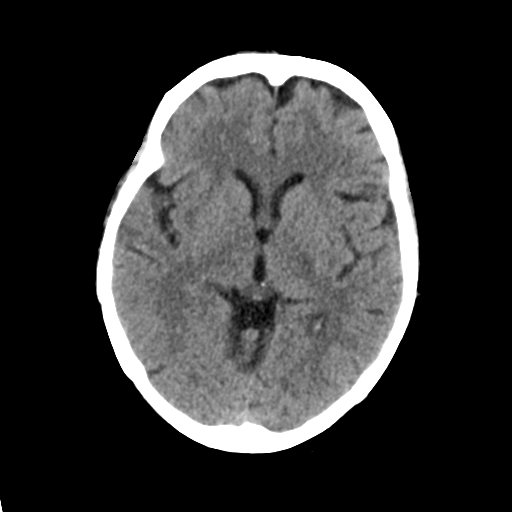
[im 17/29  brain]
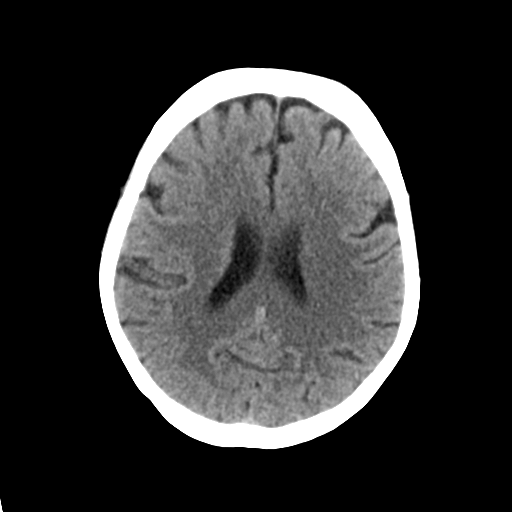
[im 17/29  bone]
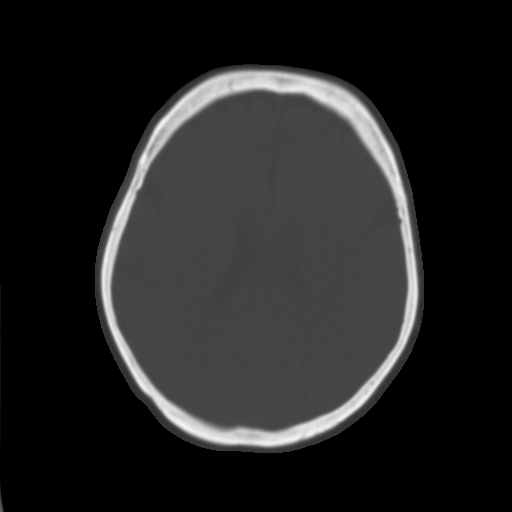
[im 20/29  brain]
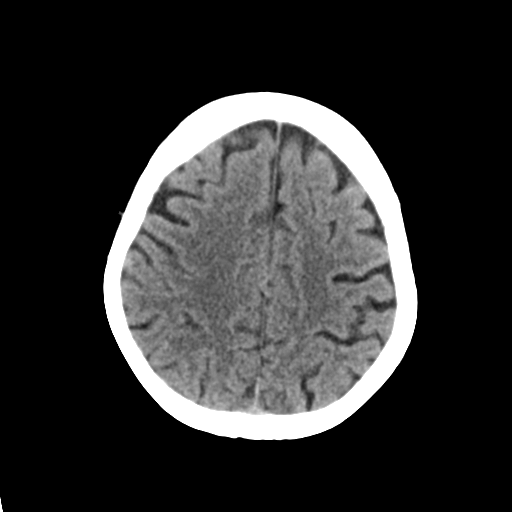
[im 23/29  brain]
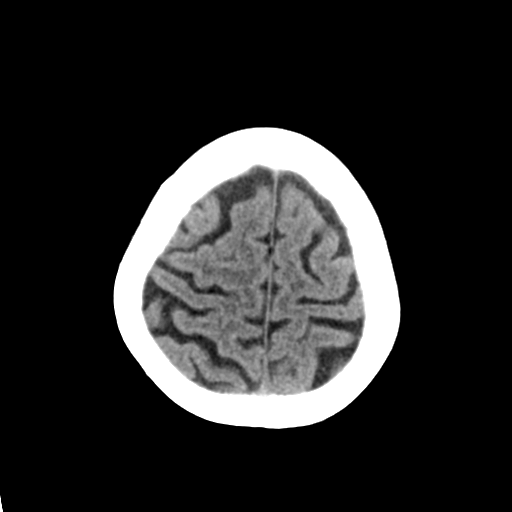
[im 27/29  brain]
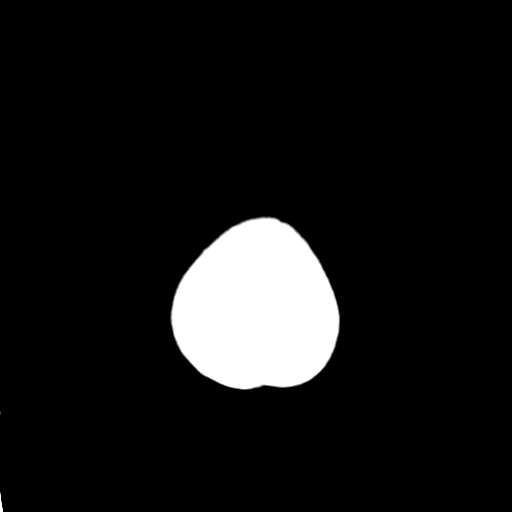

[Series 6: coronal soft tissue · coronal · 0.28mm/px · 3 of 64 slices shown]
[im 22/64  brain]
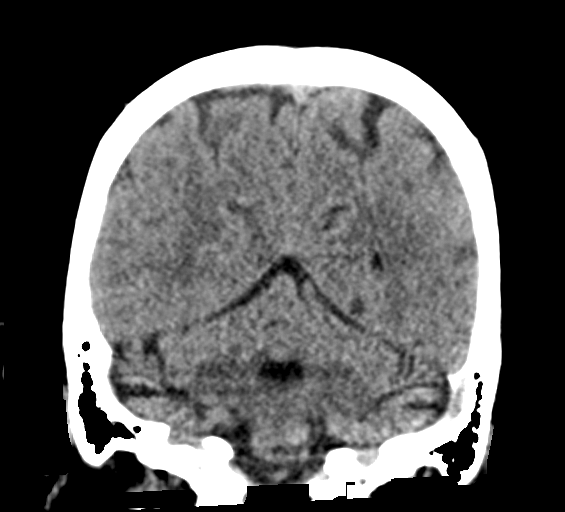
[im 29/64  brain]
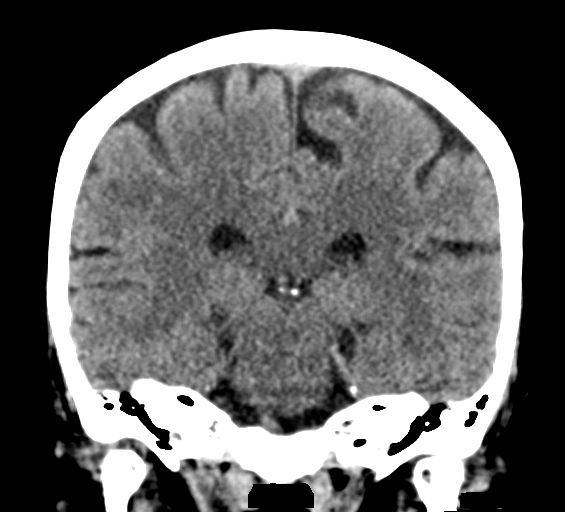
[im 36/64  brain]
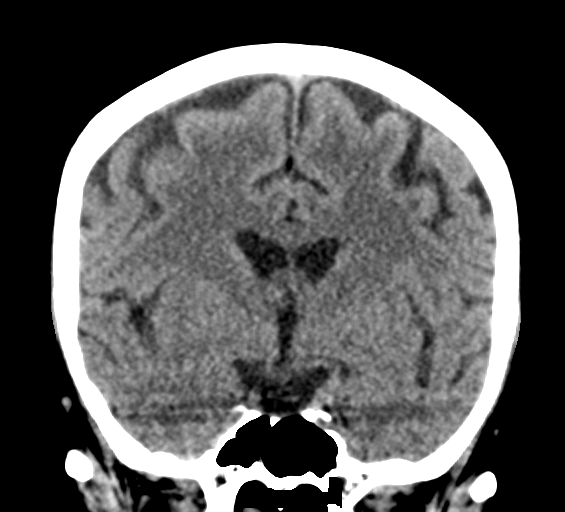

[Series 7: sagittal soft tissue · sagittal · 0.28mm/px · 3 of 54 slices shown]
[im 18/54  brain]
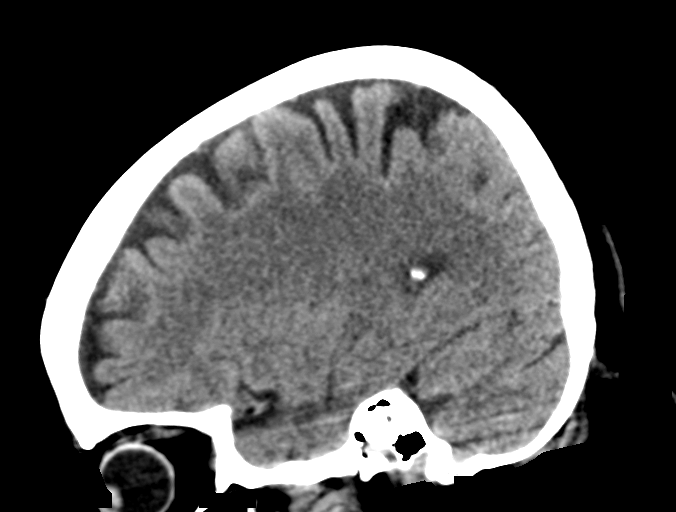
[im 27/54  brain]
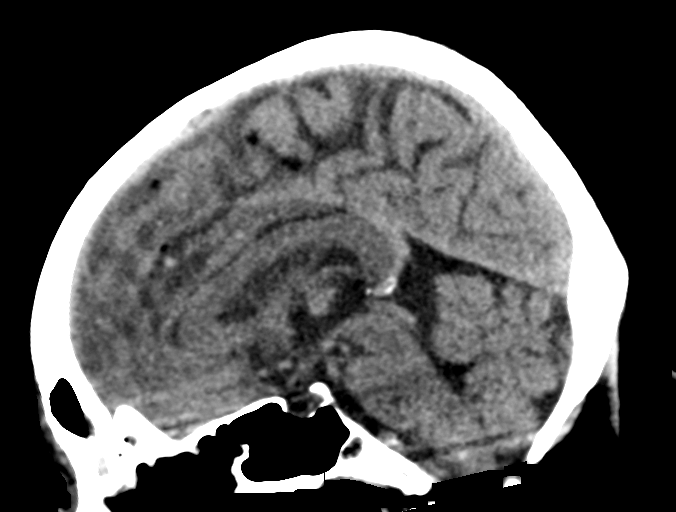
[im 36/54  brain]
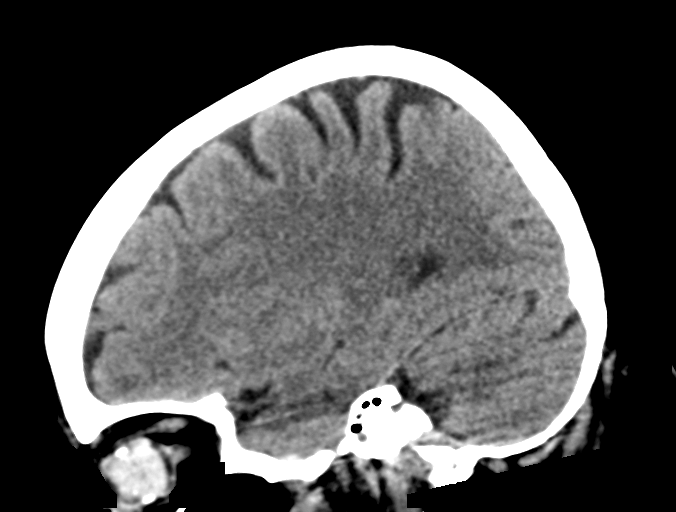

[14 of 46 positions shown; findings below may reference images not displayed]

FINDINGS: Brain: Despite frontal bone fractures no pneumocephalus identified.

No midline shift, ventriculomegaly, mass effect, evidence of mass
lesion, intracranial hemorrhage or evidence of cortically based
acute infarction. Gray-white matter differentiation is within normal
limits throughout the brain.

Vascular: Mild Calcified atherosclerosis at the skull base. No
suspicious intracranial vascular hyperdensity.

Skull: Comminuted, through and through fracture of the right frontal
sinus (series 4, image 10) and right frontoethmoidal recess.
Associated fracture of the anterior right lamina papyracea.

Anterior wall left frontal sinus fracture on series 4, image 9.

See also face CT.

No other skull fracture identified.

Sinuses/Orbits: Through and through fracture of the right frontal
sinus (series 4, image 10) with a small volume of hemorrhage within
the anterior frontal and ethmoid air cells. See also face CT
reported separately.

Tympanic cavities and mastoids appear clear.

Other: Right forehead scalp hematoma with a small volume of
subcutaneous gas likely escaping from the fractured anterior
paranasal sinuses. Small volume right intraorbital gas also.
Underlying chronic postoperative changes to the right globe. See
face CT reported separately.

Elsewhere scalp soft tissues appear normal.
IMPRESSION: 1. Comminuted skull fractures through the frontal sinuses, right
frontoethmoidal recess, and medial right orbit. See also face CT
reported separately.
2. Normal noncontrast CT appearance of the brain. No pneumocephalus
or intracranial hemorrhage identified.
3. Right forehead scalp hematoma with subcutaneous and intraorbital
gas.

## 2021-08-14 IMAGING — CT CT CERVICAL SPINE W/O CM
3 of 4 series · 12 of 33 positions shown, 14 images · non-contrast
Comparison: Head and face CT reported separately today.

CLINICAL DATA: 69-year-old female status post fall striking head.
Right eye laceration.

EXAM:
CT CERVICAL SPINE WITHOUT CONTRAST
TECHNIQUE: Multidetector CT imaging of the cervical spine was performed without
intravenous contrast. Multiplanar CT image reconstructions were also
generated.

[Series 6: orthogonal axials · axial · 0.23mm/px · z∈[-241,-128]mm · 4 of 86 slices shown, 5 images]
[im 13/86  soft-tissue]
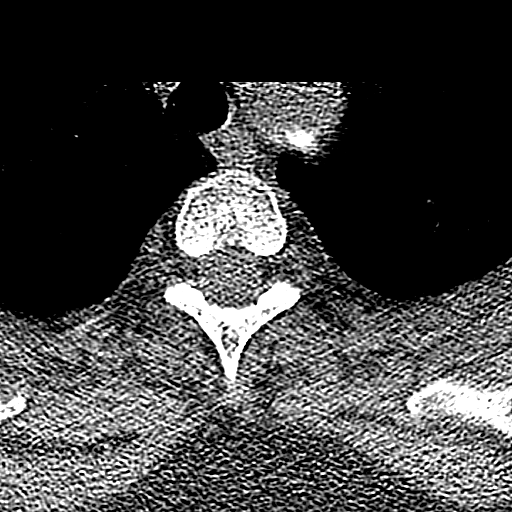
[im 13/86  bone]
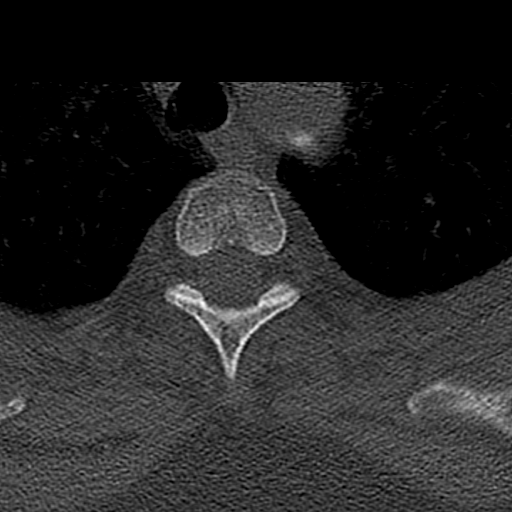
[im 37/86  bone]
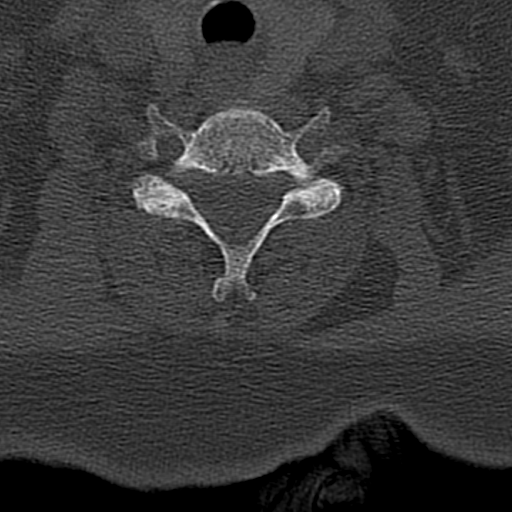
[im 49/86  bone]
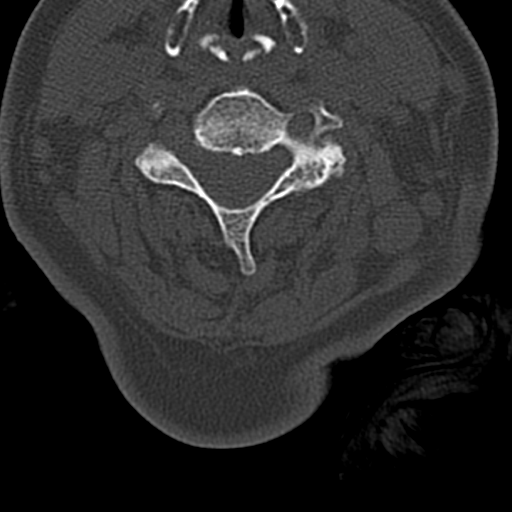
[im 73/86  bone]
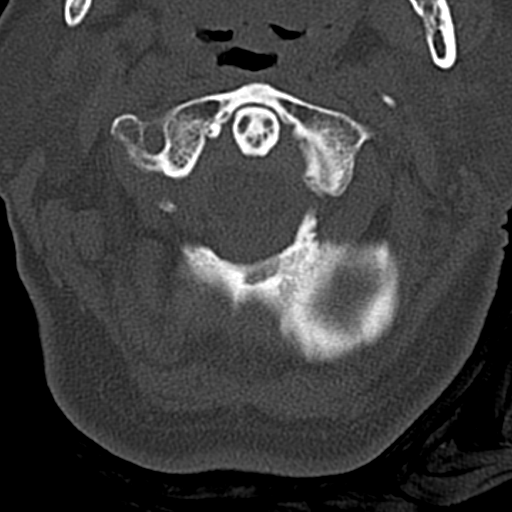

[Series 7: coronal bone · coronal · 0.23mm/px · 3 of 61 slices shown]
[im 13/61  bone]
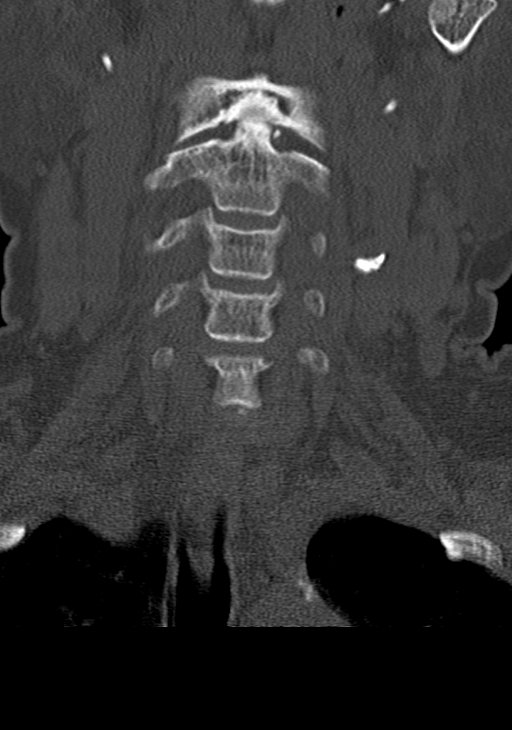
[im 25/61  bone]
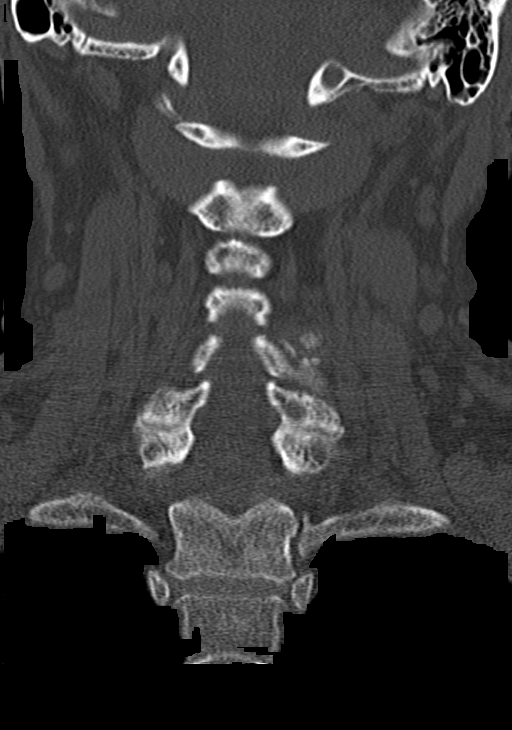
[im 37/61  bone]
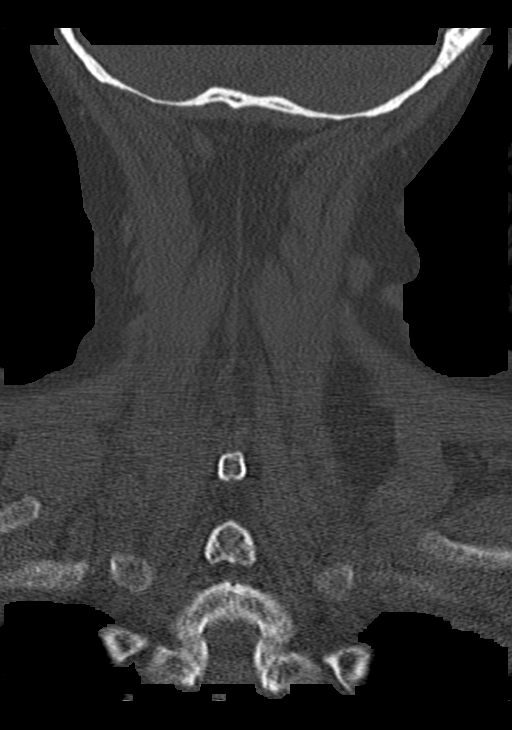

[Series 8: sagittal bone · sagittal · 0.23mm/px · 5 of 61 slices shown, 6 images]
[im 21/61  bone]
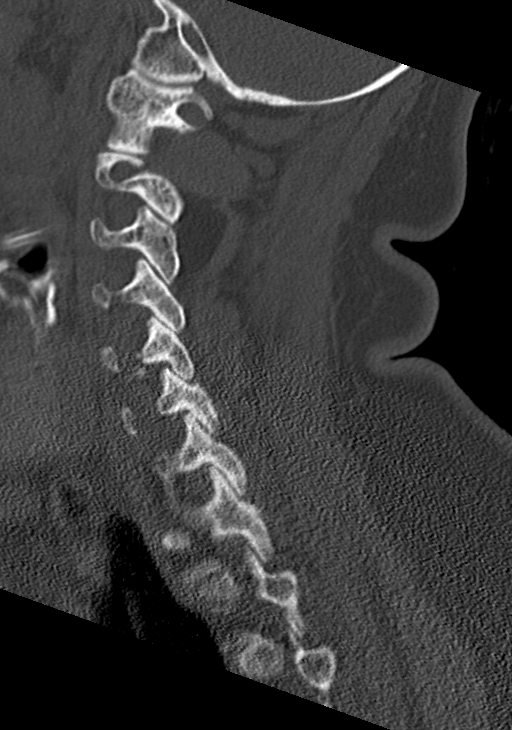
[im 26/61  bone]
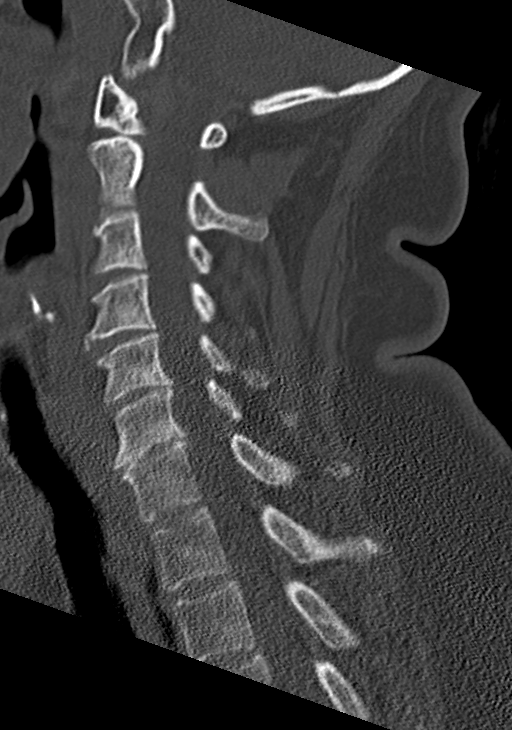
[im 31/61  soft-tissue]
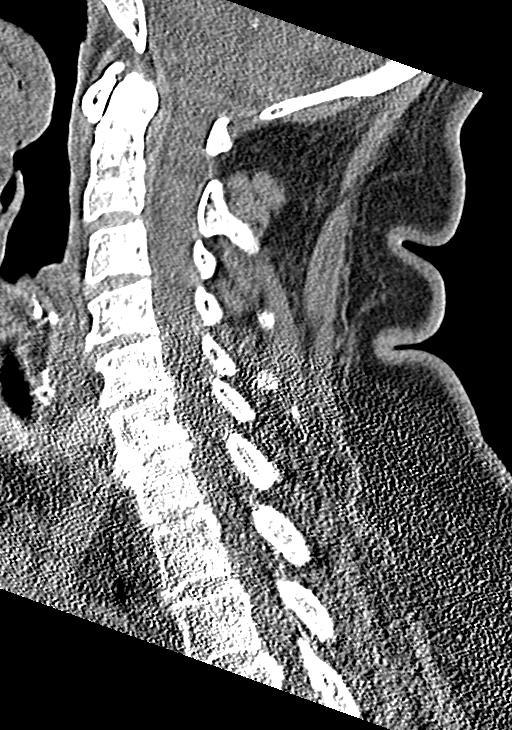
[im 31/61  bone]
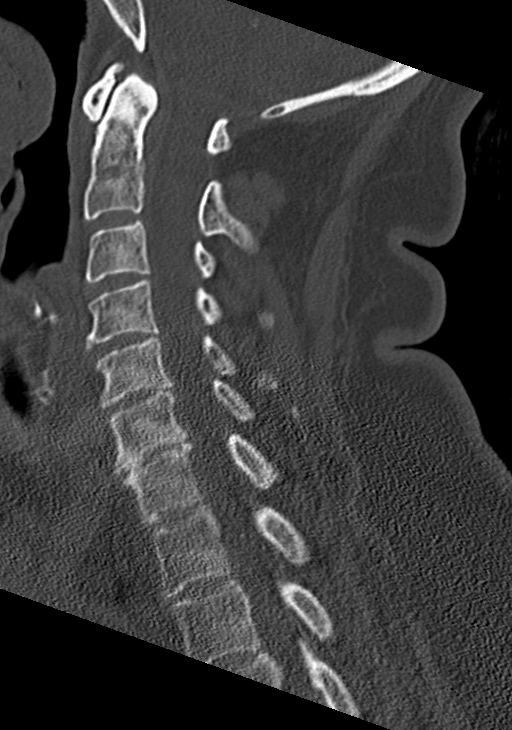
[im 36/61  bone]
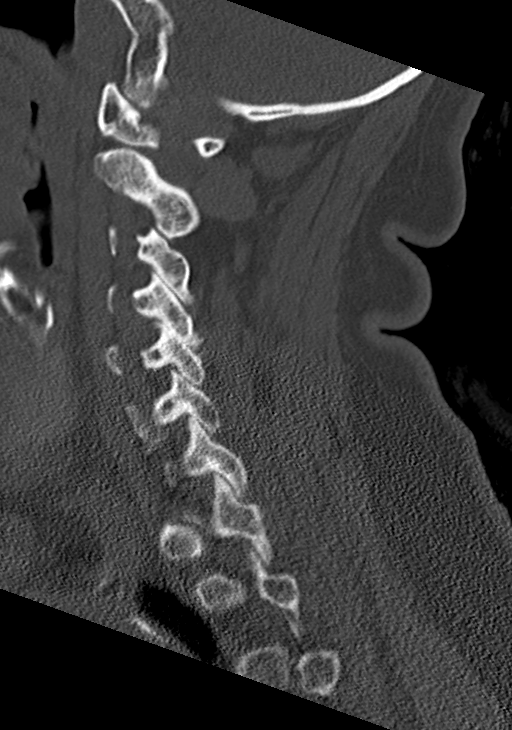
[im 41/61  bone]
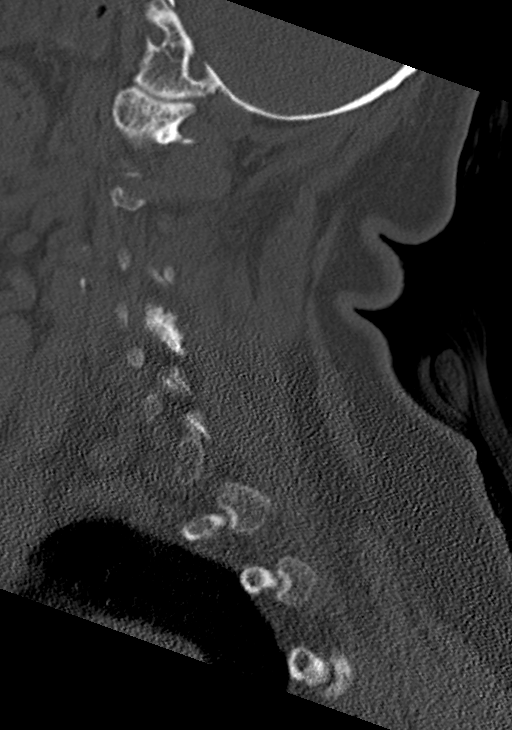

[12 of 33 positions shown; findings below may reference images not displayed]

FINDINGS: Alignment: Mild straightening of cervical lordosis. Cervicothoracic
junction alignment is within normal limits. Bilateral posterior
element alignment is within normal limits.

Skull base and vertebrae: Visualized skull base is intact. No
atlanto-occipital dissociation. No acute osseous abnormality
identified in the cervical spine.

Soft tissues and spinal canal: No prevertebral fluid or swelling. No
visible canal hematoma. Negative visible noncontrast neck soft
tissues aside from calcified carotid atherosclerosis.

Disc levels: Cervical spine degeneration with no cervical spinal
stenosis suspected.

Upper chest: Visible upper thoracic levels appear intact. Mild
motion artifact at the lung apices which appear clear. Calcified
aortic atherosclerosis.
IMPRESSION: 1. No acute traumatic injury identified in the cervical spine.
2. See Face CT reported separately.
